# Patient Record
Sex: Female | Born: 1990
Health system: Southern US, Community
[De-identification: ages and names within clinical notes are randomized; demographics above are authoritative.]

## PROBLEM LIST (undated history)

## (undated) DIAGNOSIS — Z8489 Family history of other specified conditions: Secondary | ICD-10-CM

## (undated) DIAGNOSIS — E282 Polycystic ovarian syndrome: Secondary | ICD-10-CM

## (undated) DIAGNOSIS — G43109 Migraine with aura, not intractable, without status migrainosus: Secondary | ICD-10-CM

## (undated) HISTORY — PX: EYE SURGERY: SHX253

## (undated) HISTORY — DX: Family history of other specified conditions: Z84.89

---

## 2013-12-18 ENCOUNTER — Ambulatory Visit: Payer: Self-pay | Admitting: Family Medicine

## 2014-01-18 ENCOUNTER — Other Ambulatory Visit (INDEPENDENT_AMBULATORY_CARE_PROVIDER_SITE_OTHER): Payer: Self-pay

## 2014-01-18 ENCOUNTER — Other Ambulatory Visit (INDEPENDENT_AMBULATORY_CARE_PROVIDER_SITE_OTHER): Payer: Self-pay | Admitting: General Surgery

## 2014-01-18 DIAGNOSIS — K8050 Calculus of bile duct without cholangitis or cholecystitis without obstruction: Secondary | ICD-10-CM

## 2014-01-21 ENCOUNTER — Ambulatory Visit (HOSPITAL_COMMUNITY)
Admission: RE | Admit: 2014-01-21 | Discharge: 2014-01-21 | Disposition: A | Discharge status: Home / Self Care | Payer: BC Managed Care – PPO | Source: Ambulatory Visit | Attending: General Surgery | Admitting: General Surgery

## 2014-01-21 DIAGNOSIS — K8050 Calculus of bile duct without cholangitis or cholecystitis without obstruction: Secondary | ICD-10-CM | POA: Diagnosis present

## 2014-01-21 MED ORDER — TECHNETIUM TC 99M MEBROFENIN IV KIT
5.0000 | PACK | Freq: Once | INTRAVENOUS | Status: AC | PRN
  Administered 2014-01-21: 5 via INTRAVENOUS

## 2014-01-21 MED ORDER — STERILE WATER FOR INJECTION IJ SOLN
INTRAMUSCULAR | Status: AC
  Filled 2014-01-21: qty 10

## 2014-01-21 MED ORDER — SINCALIDE 5 MCG IJ SOLR
0.0200 ug/kg | Freq: Once | INTRAMUSCULAR | Status: AC
  Administered 2014-01-21: 1.41 ug via INTRAVENOUS

## 2014-01-21 MED ORDER — SINCALIDE 5 MCG IJ SOLR
INTRAMUSCULAR | Status: AC
  Administered 2014-01-21: 1.41 ug via INTRAVENOUS
  Filled 2014-01-21: qty 5

## 2014-02-15 ENCOUNTER — Other Ambulatory Visit (INDEPENDENT_AMBULATORY_CARE_PROVIDER_SITE_OTHER): Payer: Self-pay | Admitting: General Surgery

## 2014-02-17 NOTE — Pre-Procedure Instructions (Addendum)
Jessica Johnson  02/17/2014   Your procedure is scheduled on:  Monday, February 22, 2014 at 7:30 AM.   [facility] at 5:30 AM.   Call this number if you have problems the morning of surgery: [phone]                Any questions prior to day of surgery, please call [phone].   Remember:   Do not eat food or drink liquids after midnight.   Take these medicines the morning of surgery with A SIP OF WATER:    Do not wear jewelry, make-up or nail polish.  Do not wear lotions, powders, or perfumes. You may NOT wear deodorant the morning of surgery.   Do not shave underarms & legs 48 hours prior to surgery.   Do not bring valuables to the hospital.  [facility] any belongings or valuables.               Contacts, dentures or bridgework may not be worn into surgery.  Leave suitcase in the car. After surgery it may be brought to your room.  For patients admitted to the hospital, discharge time is determined by your treatment team.             Special Instructions: [place] - Preparing for Surgery  Before surgery, you can play an important role.  Because skin is not sterile, your skin needs to be as free of germs as possible.  You can reduce the number of germs on you skin by washing with CHG (chlorahexidine gluconate) soap before surgery.  CHG is an antiseptic cleaner which kills germs and bonds with the skin to continue killing germs even after washing.  Please DO NOT use if you have an allergy to CHG or antibacterial soaps.  If your skin becomes reddened/irritated stop using the CHG and inform your nurse when you arrive at Short Stay.  Do not shave (including legs and underarms) for at least 48 hours prior to the first CHG shower.  You may shave your face.  Please follow these instructions carefully:   1.  Shower with CHG Soap the night before surgery and the  morning of Surgery.   2.  If you choose to wash  your hair, wash your hair first as usual with your normal shampoo.  3.  After you shampoo, rinse your hair and body thoroughly to remove the Shampoo.   4.  Use CHG as you would any other liquid soap.  You can apply chg directly  to the skin and wash gently with scrungie or a clean washcloth.   5.  Apply the CHG Soap to your body ONLY FROM THE NECK DOWN.   Do not use on open wounds or open sores.  Avoid contact with your eyes, ears, mouth and genitals (private parts).  Wash genitals (private parts) with your normal soap.   6.  Wash thoroughly, paying special attention to the area where your surgery will be performed.  7.  Thoroughly rinse your body with warm water from the neck down.  8.  DO NOT shower/wash with your normal soap after using and rinsing off the CHG Soap.   9.  Pat yourself dry with a clean towel.            10.  Wear clean pajamas.            11.  Place clean sheets on your bed  the night of your first shower and do not sleep with pets.  Day of Surgery  Do not apply any lotions the morning of surgery.  Please wear clean clothes to the hospital.     Please read over the following fact sheets that you were given: Pain Booklet, Coughing and Deep Breathing and Surgical Site Infection Prevention

## 2014-02-18 ENCOUNTER — Encounter (HOSPITAL_COMMUNITY): Payer: Self-pay

## 2014-02-18 ENCOUNTER — Encounter (HOSPITAL_COMMUNITY)
Admission: RE | Admit: 2014-02-18 | Discharge: 2014-02-18 | Disposition: A | Discharge status: Home / Self Care | Payer: BC Managed Care – PPO | Source: Ambulatory Visit | Attending: General Surgery | Admitting: General Surgery

## 2014-02-18 DIAGNOSIS — Z01812 Encounter for preprocedural laboratory examination: Secondary | ICD-10-CM | POA: Diagnosis present

## 2014-02-18 LAB — HCG, SERUM, QUALITATIVE: Preg, Serum: NEGATIVE

## 2014-02-18 LAB — COMPREHENSIVE METABOLIC PANEL
ALT: 15 U/L (ref 0–35)
AST: 19 U/L (ref 0–37)
Albumin: 3.8 g/dL (ref 3.5–5.2)
Alkaline Phosphatase: 53 U/L (ref 39–117)
Anion gap: 14 (ref 5–15)
BUN: 11 mg/dL (ref 6–23)
CALCIUM: 9.2 mg/dL (ref 8.4–10.5)
CO2: 24 mEq/L (ref 19–32)
CREATININE: 0.68 mg/dL (ref 0.50–1.10)
Chloride: 103 mEq/L (ref 96–112)
GFR calc Af Amer: >90 mL/min (ref >90)
GFR calc non Af Amer: >90 mL/min (ref >90)
GLUCOSE: 82 mg/dL (ref 70–99)
Potassium: 3.8 mEq/L (ref 3.7–5.3)
Sodium: 141 mEq/L (ref 137–147)
TOTAL PROTEIN: 7.6 g/dL (ref 6.0–8.3)
Total Bilirubin: 0.3 mg/dL (ref 0.3–1.2)

## 2014-02-18 LAB — CBC
HEMATOCRIT: 40.1 % (ref 36.0–46.0)
Hemoglobin: 13 g/dL (ref 12.0–15.0)
MCH: 25.8 pg — ABNORMAL LOW (ref 26.0–34.0)
MCHC: 32.4 g/dL (ref 30.0–36.0)
MCV: 79.7 fL (ref 78.0–100.0)
Platelets: 242 10*3/uL (ref 150–400)
RBC: 5.03 MIL/uL (ref 3.87–5.11)
RDW: 14.1 % (ref 11.5–15.5)
WBC: 6.5 10*3/uL (ref 4.0–10.5)

## 2014-02-21 MED ORDER — CHLORHEXIDINE GLUCONATE 4 % EX LIQD
1.0000 "application " | Freq: Once | CUTANEOUS | Status: DC
  Filled 2014-02-21: qty 15

## 2014-02-22 ENCOUNTER — Encounter (HOSPITAL_COMMUNITY): Payer: Self-pay | Admitting: *Deleted

## 2014-02-22 ENCOUNTER — Encounter (HOSPITAL_COMMUNITY)
Admission: RE | Disposition: A | Discharge status: Home / Self Care | Payer: Self-pay | Source: Ambulatory Visit | Attending: General Surgery

## 2014-02-22 ENCOUNTER — Ambulatory Visit (HOSPITAL_COMMUNITY): Payer: BC Managed Care – PPO

## 2014-02-22 ENCOUNTER — Ambulatory Visit (HOSPITAL_COMMUNITY): Payer: BC Managed Care – PPO | Admitting: Anesthesiology

## 2014-02-22 ENCOUNTER — Ambulatory Visit (HOSPITAL_COMMUNITY)
Admission: RE | Admit: 2014-02-22 | Discharge: 2014-02-22 | Disposition: A | Discharge status: Home / Self Care | Payer: BC Managed Care – PPO | Source: Ambulatory Visit | Attending: General Surgery | Admitting: General Surgery

## 2014-02-22 DIAGNOSIS — K219 Gastro-esophageal reflux disease without esophagitis: Secondary | ICD-10-CM | POA: Diagnosis not present

## 2014-02-22 DIAGNOSIS — K819 Cholecystitis, unspecified: Secondary | ICD-10-CM | POA: Insufficient documentation

## 2014-02-22 DIAGNOSIS — K8050 Calculus of bile duct without cholangitis or cholecystitis without obstruction: Secondary | ICD-10-CM | POA: Diagnosis present

## 2014-02-22 DIAGNOSIS — K8020 Calculus of gallbladder without cholecystitis without obstruction: Secondary | ICD-10-CM

## 2014-02-22 HISTORY — PX: CHOLECYSTECTOMY: SHX55

## 2014-02-22 SURGERY — LAPAROSCOPIC CHOLECYSTECTOMY WITH INTRAOPERATIVE CHOLANGIOGRAM
Anesthesia: General | Site: Abdomen

## 2014-02-22 MED ORDER — FENTANYL CITRATE 0.05 MG/ML IJ SOLN
INTRAMUSCULAR | Status: AC
  Filled 2014-02-22: qty 5

## 2014-02-22 MED ORDER — FENTANYL CITRATE 0.05 MG/ML IJ SOLN
INTRAMUSCULAR | Status: DC | PRN
  Administered 2014-02-22: 100 ug via INTRAVENOUS
  Administered 2014-02-22: 50 ug via INTRAVENOUS
  Administered 2014-02-22: 100 ug via INTRAVENOUS
  Administered 2014-02-22: 50 ug via INTRAVENOUS

## 2014-02-22 MED ORDER — DEXAMETHASONE SODIUM PHOSPHATE 10 MG/ML IJ SOLN
INTRAMUSCULAR | Status: DC | PRN
  Administered 2014-02-22: 10 mg via INTRAVENOUS

## 2014-02-22 MED ORDER — NEOSTIGMINE METHYLSULFATE 10 MG/10ML IV SOLN
INTRAVENOUS | Status: DC | PRN
  Administered 2014-02-22: 4 mg via INTRAVENOUS

## 2014-02-22 MED ORDER — ROCURONIUM BROMIDE 100 MG/10ML IV SOLN
INTRAVENOUS | Status: DC | PRN
  Administered 2014-02-22: 50 mg via INTRAVENOUS

## 2014-02-22 MED ORDER — SODIUM CHLORIDE 0.9 % IR SOLN
Status: DC | PRN
  Administered 2014-02-22: 1000 mL

## 2014-02-22 MED ORDER — CEFAZOLIN SODIUM-DEXTROSE 2-3 GM-% IV SOLR
INTRAVENOUS | Status: AC
  Filled 2014-02-22: qty 50

## 2014-02-22 MED ORDER — HYDROMORPHONE HCL 1 MG/ML IJ SOLN
INTRAMUSCULAR | Status: AC
  Administered 2014-02-22: 0.5 mg via INTRAVENOUS
  Filled 2014-02-22: qty 1

## 2014-02-22 MED ORDER — MIDAZOLAM HCL 2 MG/2ML IJ SOLN
INTRAMUSCULAR | Status: AC
  Filled 2014-02-22: qty 2

## 2014-02-22 MED ORDER — OXYCODONE-ACETAMINOPHEN 5-325 MG PO TABS: 1.0000 | ORAL_TABLET | ORAL | Status: DC | PRN

## 2014-02-22 MED ORDER — LIDOCAINE HCL (CARDIAC) 20 MG/ML IV SOLN
INTRAVENOUS | Status: DC | PRN
  Administered 2014-02-22: 100 mg via INTRAVENOUS

## 2014-02-22 MED ORDER — ONDANSETRON HCL 4 MG/2ML IJ SOLN
INTRAMUSCULAR | Status: DC | PRN
  Administered 2014-02-22 (×2): 4 mg via INTRAVENOUS

## 2014-02-22 MED ORDER — SODIUM CHLORIDE 0.9 % IV SOLN
INTRAVENOUS | Status: DC | PRN
  Administered 2014-02-22: 07:00:00

## 2014-02-22 MED ORDER — GLYCOPYRROLATE 0.2 MG/ML IJ SOLN
INTRAMUSCULAR | Status: AC
  Filled 2014-02-22: qty 3

## 2014-02-22 MED ORDER — OXYCODONE HCL 5 MG/5ML PO SOLN: 5.0000 mg | Freq: Once | ORAL | Status: DC | PRN

## 2014-02-22 MED ORDER — MIDAZOLAM HCL 5 MG/5ML IJ SOLN
INTRAMUSCULAR | Status: DC | PRN
  Administered 2014-02-22 (×2): 1 mg via INTRAVENOUS

## 2014-02-22 MED ORDER — ROCURONIUM BROMIDE 50 MG/5ML IV SOLN
INTRAVENOUS | Status: AC
  Filled 2014-02-22: qty 1

## 2014-02-22 MED ORDER — OXYCODONE HCL 5 MG PO TABS: 5.0000 mg | ORAL_TABLET | Freq: Once | ORAL | Status: DC | PRN

## 2014-02-22 MED ORDER — PROMETHAZINE HCL 25 MG/ML IJ SOLN
6.2500 mg | INTRAMUSCULAR | Status: DC | PRN
  Administered 2014-02-22: 6.25 mg via INTRAVENOUS

## 2014-02-22 MED ORDER — LACTATED RINGERS IV SOLN
INTRAVENOUS | Status: DC | PRN
  Administered 2014-02-22: 07:00:00 via INTRAVENOUS

## 2014-02-22 MED ORDER — ACETAMINOPHEN 325 MG PO TABS
ORAL_TABLET | ORAL | Status: AC
  Filled 2014-02-22: qty 2

## 2014-02-22 MED ORDER — PROPOFOL 10 MG/ML IV BOLUS
INTRAVENOUS | Status: AC
  Filled 2014-02-22: qty 20

## 2014-02-22 MED ORDER — BUPIVACAINE-EPINEPHRINE 0.25% -1:200000 IJ SOLN
INTRAMUSCULAR | Status: DC | PRN
  Administered 2014-02-22: 30 mL

## 2014-02-22 MED ORDER — HYDROMORPHONE HCL 1 MG/ML IJ SOLN
0.2500 mg | INTRAMUSCULAR | Status: DC | PRN
  Administered 2014-02-22 (×4): 0.5 mg via INTRAVENOUS

## 2014-02-22 MED ORDER — CEFAZOLIN SODIUM-DEXTROSE 2-3 GM-% IV SOLR: 2.0000 g | INTRAVENOUS | Status: DC

## 2014-02-22 MED ORDER — OXYCODONE-ACETAMINOPHEN 5-325 MG PO TABS
ORAL_TABLET | ORAL | Status: AC
  Administered 2014-02-22: 2 via ORAL
  Filled 2014-02-22: qty 2

## 2014-02-22 MED ORDER — NEOSTIGMINE METHYLSULFATE 10 MG/10ML IV SOLN
INTRAVENOUS | Status: AC
  Filled 2014-02-22: qty 1

## 2014-02-22 MED ORDER — PROMETHAZINE HCL 25 MG/ML IJ SOLN
INTRAMUSCULAR | Status: AC
  Administered 2014-02-22: 6.25 mg via INTRAVENOUS
  Filled 2014-02-22: qty 1

## 2014-02-22 MED ORDER — GLYCOPYRROLATE 0.2 MG/ML IJ SOLN
INTRAMUSCULAR | Status: DC | PRN
  Administered 2014-02-22: 0.6 mg via INTRAVENOUS

## 2014-02-22 MED ORDER — OXYCODONE HCL 5 MG PO TABS
ORAL_TABLET | ORAL | Status: DC
Start: 2014-02-22 — End: 2014-02-22
  Filled 2014-02-22: qty 1

## 2014-02-22 MED ORDER — PROPOFOL 10 MG/ML IV BOLUS
INTRAVENOUS | Status: DC | PRN
  Administered 2014-02-22: 200 mg via INTRAVENOUS

## 2014-02-22 MED ORDER — 0.9 % SODIUM CHLORIDE (POUR BTL) OPTIME
TOPICAL | Status: DC | PRN
  Administered 2014-02-22: 1000 mL

## 2014-02-22 MED ORDER — BUPIVACAINE-EPINEPHRINE (PF) 0.25% -1:200000 IJ SOLN
INTRAMUSCULAR | Status: AC
  Filled 2014-02-22: qty 30

## 2014-02-22 MED ORDER — ONDANSETRON HCL 4 MG/2ML IJ SOLN
INTRAMUSCULAR | Status: AC
  Filled 2014-02-22: qty 2

## 2014-02-22 MED ORDER — OXYCODONE-ACETAMINOPHEN 5-325 MG PO TABS
1.0000 | ORAL_TABLET | Freq: Once | ORAL | Status: AC
  Administered 2014-02-22: 2 via ORAL

## 2014-02-22 MED ORDER — CIPROFLOXACIN IN D5W 400 MG/200ML IV SOLN
INTRAVENOUS | Status: AC
  Administered 2014-02-22: 400 mg via INTRAVENOUS
  Filled 2014-02-22: qty 200

## 2014-02-22 MED ORDER — LIDOCAINE HCL (CARDIAC) 20 MG/ML IV SOLN
INTRAVENOUS | Status: AC
  Filled 2014-02-22: qty 5

## 2014-02-22 SURGICAL SUPPLY — 45 items
APPLIER CLIP 5 13 M/L LIGAMAX5 (MISCELLANEOUS) ×2
CANISTER SUCTION 2500CC (MISCELLANEOUS) ×2 IMPLANT
CATH REDDICK CHOLANGI 4FR 50CM (CATHETERS) ×2 IMPLANT
CHLORAPREP W/TINT 26ML (MISCELLANEOUS) ×2 IMPLANT
CLIP APPLIE 5 13 M/L LIGAMAX5 (MISCELLANEOUS) ×1 IMPLANT
COVER MAYO STAND STRL (DRAPES) ×2 IMPLANT
COVER SURGICAL LIGHT HANDLE (MISCELLANEOUS) ×4 IMPLANT
DERMABOND ADVANCED (GAUZE/BANDAGES/DRESSINGS) ×1
DERMABOND ADVANCED .7 DNX12 (GAUZE/BANDAGES/DRESSINGS) ×1 IMPLANT
DRAPE C-ARM 42X72 X-RAY (DRAPES) ×2 IMPLANT
DRAPE LAPAROSCOPIC ABDOMINAL (DRAPES) ×2 IMPLANT
DRAPE UTILITY 15X26 W/TAPE STR (DRAPE) ×4 IMPLANT
ELECT REM PT RETURN 9FT ADLT (ELECTROSURGICAL) ×2
ELECTRODE REM PT RTRN 9FT ADLT (ELECTROSURGICAL) ×1 IMPLANT
GLOVE BIO SURGEON STRL SZ7.5 (GLOVE) ×2 IMPLANT
GLOVE BIO SURGEON STRL SZ8 (GLOVE) ×2 IMPLANT
GLOVE BIOGEL PI IND STRL 6.5 (GLOVE) ×1 IMPLANT
GLOVE BIOGEL PI IND STRL 7.0 (GLOVE) ×1 IMPLANT
GLOVE BIOGEL PI IND STRL 7.5 (GLOVE) ×1 IMPLANT
GLOVE BIOGEL PI IND STRL 8 (GLOVE) ×1 IMPLANT
GLOVE BIOGEL PI INDICATOR 6.5 (GLOVE) ×1
GLOVE BIOGEL PI INDICATOR 7.0 (GLOVE) ×1
GLOVE BIOGEL PI INDICATOR 7.5 (GLOVE) ×1
GLOVE BIOGEL PI INDICATOR 8 (GLOVE) ×1
GLOVE ECLIPSE 7.5 STRL STRAW (GLOVE) ×2 IMPLANT
GLOVE SURG SS PI 6.5 STRL IVOR (GLOVE) ×2 IMPLANT
GOWN STRL REUS W/ TWL LRG LVL3 (GOWN DISPOSABLE) ×4 IMPLANT
GOWN STRL REUS W/TWL LRG LVL3 (GOWN DISPOSABLE) ×4
IV CATH 14GX2 1/4 (CATHETERS) ×2 IMPLANT
KIT BASIN OR (CUSTOM PROCEDURE TRAY) ×2 IMPLANT
KIT ROOM TURNOVER OR (KITS) ×2 IMPLANT
NS IRRIG 1000ML POUR BTL (IV SOLUTION) ×2 IMPLANT
PAD ARMBOARD 7.5X6 YLW CONV (MISCELLANEOUS) ×2 IMPLANT
POUCH SPECIMEN RETRIEVAL 10MM (ENDOMECHANICALS) ×2 IMPLANT
SCISSORS LAP 5X35 DISP (ENDOMECHANICALS) ×2 IMPLANT
SET IRRIG TUBING LAPAROSCOPIC (IRRIGATION / IRRIGATOR) ×2 IMPLANT
SLEEVE ENDOPATH XCEL 5M (ENDOMECHANICALS) ×4 IMPLANT
SPECIMEN JAR SMALL (MISCELLANEOUS) ×2 IMPLANT
SUT MNCRL AB 4-0 PS2 18 (SUTURE) ×2 IMPLANT
TOWEL OR 17X24 6PK STRL BLUE (TOWEL DISPOSABLE) ×2 IMPLANT
TOWEL OR 17X26 10 PK STRL BLUE (TOWEL DISPOSABLE) ×2 IMPLANT
TRAY LAPAROSCOPIC (CUSTOM PROCEDURE TRAY) ×2 IMPLANT
TROCAR XCEL BLUNT TIP 100MML (ENDOMECHANICALS) ×2 IMPLANT
TROCAR XCEL NON-BLD 5MMX100MML (ENDOMECHANICALS) ×2 IMPLANT
TUBING INSUFFLATION (TUBING) ×2 IMPLANT

## 2014-02-22 NOTE — Interval H&P Note (Signed)
History and Physical Interval Note:  02/22/2014 6:58 AM  Jessica Johnson  has presented today for surgery, with the diagnosis of Biliary Colic  The various methods of treatment have been discussed with the patient and family. After consideration of risks, benefits and other options for treatment, the patient has consented to  Procedure(s): LAPAROSCOPIC CHOLECYSTECTOMY WITH INTRAOPERATIVE CHOLANGIOGRAM (N/A) as a surgical intervention .  The patient's history has been reviewed, patient examined, no change in status, stable for surgery.  I have reviewed the patient's chart and labs.  Questions were answered to the patient's satisfaction.     TOTH III,PAUL S

## 2014-02-22 NOTE — Discharge Instructions (Signed)

## 2014-02-22 NOTE — H&P (Signed)
Jessica Johnson 01/21/2014 2:06 PM Location: [facility] Patient #: [id number] DOB: 1991-03-04 Married / Language: English / Race: White Female  History of Present Illness Jessica Johnson(Micael Barb S. Carolynne Edouardoth MD; 01/21/2014 2:36 PM) Patient words: new Pt. Gallbladder.  The patient is a 23 year old female who presents with abdominal pain. We're asked to see the patient in consultation by Dr. Sherryll BurgerShah to evaluate her for biliary colic. The patient is a 23 year old white female who has been experiencing postprandial abdominal pain with nausea and vomiting for the past several months. Her fatty foods seem to make this worse. She underwent an ultrasound which showed a small polyp in the gallbladder. She also had a CCK HIDA scan which showed normal ejection fraction but her symptoms were recreated with the injection of CCK.   Other Problems Jessica Johnson(Michele Daniels, CMA; 01/21/2014 2:06 PM) Gastroesophageal Reflux Disease Hemorrhoids Migraine Headache  Past Surgical History Jessica Johnson(Michele Daniels, CMA; 01/21/2014 2:06 PM) No pertinent past surgical history  Diagnostic Studies History Jessica Johnson(Michele Daniels, CMA; 01/21/2014 2:06 PM) Colonoscopy never Mammogram never Pap Smear 1-5 years ago  Allergies Jessica Johnson(Michele Daniels, CMA; 01/21/2014 2:08 PM) Penicillins  Medication History Jessica Johnson(Michele Daniels, CMA; 01/21/2014 2:10 PM) Zofran (8MG  Tablet, Oral) Active. Magnesium Gluconate (500MG  Tablet, Oral) Active.  Pregnancy / Birth History Jessica Johnson(Michele Daniels, CMA; 01/21/2014 2:06 PM) Age at menarche 12 years. Gravida 0 Para 0 Regular periods  Review of Systems Jessica Johnson(Michele Daniels CMA; 01/21/2014 2:06 PM) General Present- Fatigue. Not Present- Appetite Loss, Chills, Fever, Night Sweats, Weight Gain and Weight Loss. Skin Not Present- Change in Wart/Mole, Dryness, Hives, Jaundice, New Lesions, Non-Healing Wounds, Rash and Ulcer. HEENT Not Present- Earache, Hearing Loss, Hoarseness, Nose Bleed, Oral Ulcers, Ringing in the Ears,  Seasonal Allergies, Sinus Pain, Sore Throat, Visual Disturbances, Wears glasses/contact lenses and Yellow Eyes. Respiratory Not Present- Bloody sputum, Chronic Cough, Difficulty Breathing, Snoring and Wheezing. Breast Not Present- Breast Mass, Breast Pain, Nipple Discharge and Skin Changes. Cardiovascular Not Present- Chest Pain, Difficulty Breathing Lying Down, Leg Cramps, Palpitations, Rapid Heart Rate, Shortness of Breath and Swelling of Extremities. Gastrointestinal Present- Abdominal Pain, Bloating, Constipation, Indigestion, Nausea and Vomiting. Not Present- Bloody Stool, Change in Bowel Habits, Chronic diarrhea, Difficulty Swallowing, Excessive gas, Gets full quickly at meals, Hemorrhoids and Rectal Pain. Female Genitourinary Not Present- Frequency, Nocturia, Painful Urination, Pelvic Pain and Urgency. Musculoskeletal Not Present- Back Pain, Joint Pain, Joint Stiffness, Muscle Pain, Muscle Weakness and Swelling of Extremities. Neurological Not Present- Decreased Memory, Fainting, Headaches, Numbness, Seizures, Tingling, Tremor, Trouble walking and Weakness. Psychiatric Not Present- Anxiety, Bipolar, Change in Sleep Pattern, Depression, Fearful and Frequent crying. Endocrine Not Present- Cold Intolerance, Excessive Hunger, Hair Changes, Heat Intolerance, Hot flashes and New Diabetes. Hematology Not Present- Easy Bruising, Excessive bleeding, Gland problems, HIV and Persistent Infections.   Vitals Jessica Johnson(Michele Daniels CMA; 01/21/2014 2:11 PM) 01/21/2014 2:11 PM Weight: 163.5 lb Height: 66in Body Surface Area: 1.86 m Body Mass Index: 26.39 kg/m Temp.: 96.3F  Pulse: 58 (Regular)  BP: 102/58 (Sitting, Left Arm, Standard)    Physical Exam Renae Fickle(Denali Becvar S. Carolynne Edouardoth MD; 01/21/2014 2:37 PM) General Mental Status-Alert. General Appearance-Consistent with stated age. Hydration-Well hydrated. Voice-Normal.  Head and Neck Head-normocephalic, atraumatic with no lesions or palpable  masses. Trachea-midline. Thyroid Gland Characteristics - normal size and consistency.  Eye Eyeball - Bilateral-Extraocular movements intact. Sclera/Conjunctiva - Bilateral-No scleral icterus.  Chest and Lung Exam Chest and lung exam reveals -quiet, even and easy respiratory effort with no use of accessory muscles and on auscultation, normal breath sounds, no  adventitious sounds and normal vocal resonance. Inspection Chest Wall - Normal. Back - normal.  Breast Breast - Left-Symmetric, Non Tender, No Biopsy scars, no Dimpling, No Inflammation, No Lumpectomy scars, No Mastectomy scars, No Peau d' Orange. Breast - Right-Symmetric, Non Tender, No Biopsy scars, no Dimpling, No Inflammation, No Lumpectomy scars, No Mastectomy scars, No Peau d' Orange. Breast Lump-No Palpable Breast Mass.  Cardiovascular Cardiovascular examination reveals -normal heart sounds, regular rate and rhythm with no murmurs and normal pedal pulses bilaterally.  Abdomen Note: The abdomen is soft with minimal tenderness in the upper abdomen. There is no palpable mass. There is no guarding.   Neurologic Neurologic evaluation reveals -alert and oriented x 3 with no impairment of recent or remote memory. Mental Status-Normal.  Musculoskeletal Normal Exam - Left-Upper Extremity Strength Normal and Lower Extremity Strength Normal. Normal Exam - Right-Upper Extremity Strength Normal and Lower Extremity Strength Normal.  Lymphatic Head & Neck  General Head & Neck Lymphatics: Bilateral - Description - Normal. Axillary  General Axillary Region: Bilateral - Description - Normal. Tenderness - Non Tender. Femoral & Inguinal  Generalized Femoral & Inguinal Lymphatics: Bilateral - Description - Normal. Tenderness - Non Tender.    Assessment & Plan Renae Fickle(Shandreka Dante S. Toth MD; 01/21/2014 2:33 PM) BILIARY COLIC (574.20  K80.50) Impression: The patient has had recurring biliary colic type symptoms for  the last several months. Because of the risk of further painful episodes I think she would benefit from having her gallbladder removed. She understands that there is no guarantee that this will help her symptoms. I have explained to her in detail the dress and benefits of the operation to remove the gallbladder as well some of the technical aspects and she understands and wishes to proceed.     Signed by Caleen EssexPaul S Toth, MD (01/21/2014 2:38 PM)

## 2014-02-22 NOTE — Anesthesia Preprocedure Evaluation (Addendum)
Anesthesia Evaluation  Patient identified by MRN, date of birth, ID band Patient awake    Reviewed: Allergy & Precautions, H&P , NPO status , Patient's Chart, lab work & pertinent test results  Airway Mallampati: I  TM Distance: <3 FB Neck ROM: Full    Dental  (+) Teeth Intact, Dental Advisory Given   Pulmonary neg pulmonary ROS,  breath sounds clear to auscultation        Cardiovascular negative cardio ROS  Rhythm:Regular Rate:Normal     Neuro/Psych    GI/Hepatic Neg liver ROS,   Endo/Other  negative endocrine ROS  Renal/GU negative Renal ROS     Musculoskeletal negative musculoskeletal ROS (+)   Abdominal   Peds  Hematology negative hematology ROS (+)   Anesthesia Other Findings   Reproductive/Obstetrics                            Anesthesia Physical Anesthesia Plan  ASA: I  Anesthesia Plan: General   Post-op Pain Management:    Induction: Intravenous  Airway Management Planned: Oral ETT  Additional Equipment:   Intra-op Plan:   Post-operative Plan: Extubation in OR  Informed Consent: I have reviewed the patients History and Physical, chart, labs and discussed the procedure including the risks, benefits and alternatives for the proposed anesthesia with the patient or authorized representative who has indicated his/her understanding and acceptance.   Dental advisory given  Plan Discussed with: CRNA and Surgeon  Anesthesia Plan Comments:         Anesthesia Quick Evaluation

## 2014-02-22 NOTE — Anesthesia Postprocedure Evaluation (Signed)
  Anesthesia Post-op Note  Patient: Jessica Johnson  Procedure(s) Performed: Procedure(s): LAPAROSCOPIC CHOLECYSTECTOMY WITH INTRAOPERATIVE CHOLANGIOGRAM (N/A)  Patient Location: PACU  Anesthesia Type:General  Level of Consciousness: awake and alert   Airway and Oxygen Therapy: Patient Spontanous Breathing  Post-op Pain: mild  Post-op Assessment: Post-op Vital signs reviewed  Post-op Vital Signs: stable  Last Vitals:  Filed Vitals:   02/22/14 1102  BP: 119/66  Pulse: 70  Temp: 36.3 C  Resp: 20    Complications: No apparent anesthesia complications

## 2014-02-22 NOTE — Transfer of Care (Signed)
Immediate Anesthesia Transfer of Care Note  Patient: Jessica Johnson  Procedure(s) Performed: Procedure(s): LAPAROSCOPIC CHOLECYSTECTOMY WITH INTRAOPERATIVE CHOLANGIOGRAM (N/A)  Patient Location: PACU  Anesthesia Type:General  Level of Consciousness: awake and alert   Airway & Oxygen Therapy: Patient Spontanous Breathing and Patient connected to nasal cannula oxygen  Post-op Assessment: Report given to PACU RN and Post -op Vital signs reviewed and stable  Post vital signs: Reviewed and stable  Complications: No apparent anesthesia complications

## 2014-02-22 NOTE — Anesthesia Procedure Notes (Signed)
Procedure Name: Intubation Date/Time: 02/22/2014 7:35 AM Performed by: Armandina GemmaMIRARCHI, Haden Cavenaugh Pre-anesthesia Checklist: Patient identified, Timeout performed, Emergency Drugs available, Suction available and Patient being monitored Patient Re-evaluated:Patient Re-evaluated prior to inductionOxygen Delivery Method: Circle system utilized Preoxygenation: Pre-oxygenation with 100% oxygen Intubation Type: IV induction Ventilation: Mask ventilation without difficulty Laryngoscope Size: Miller and 2 Grade View: Grade II Tube type: Oral Tube size: 7.0 mm Number of attempts: 1 Airway Equipment and Method: Stylet Placement Confirmation: ETT inserted through vocal cords under direct vision,  positive ETCO2 and breath sounds checked- equal and bilateral Secured at: 22 cm Tube secured with: Tape Dental Injury: Teeth and Oropharynx as per pre-operative assessment  Comments: IV induction Massagee- good mask- laryngoscopy with MAC by paramedic student-Connor Fransico MichaelBrennan- supervised by Dr Jacklynn BueMassagee- student reports poor view- epiglottis only- CRNA laryngoscopy with Hyacinth MeekerMiller 2 - blood in back of throat present as Hyacinth MeekerMiller was advanced - suctioned- advanced ETT thru cords- atraumatic teeth and mouth as preop

## 2014-02-22 NOTE — Op Note (Addendum)
02/22/2014  8:47 AM  PATIENT:  Jessica Johnson  23 y.o. female  PRE-OPERATIVE DIAGNOSIS:  Biliary Colic  POST-OPERATIVE DIAGNOSIS:  Biliary Colic  PROCEDURE:  Procedure(s): LAPAROSCOPIC CHOLECYSTECTOMY WITH INTRAOPERATIVE CHOLANGIOGRAM (N/A)  SURGEON:  Surgeon(s) and Role:    * Griselda MinerPaul Toth III, MD - Primary  PHYSICIAN ASSISTANT:   ASSISTANTS: Myrtie SomanSharon Hitchcock, RNFA   ANESTHESIA:   general  EBL:     BLOOD ADMINISTERED:none  DRAINS: none   LOCAL MEDICATIONS USED:  MARCAINE     SPECIMEN:  Source of Specimen:  gallbladder  DISPOSITION OF SPECIMEN:  PATHOLOGY  COUNTS:  YES  TOURNIQUET:  * No tourniquets in log *  DICTATION: .Dragon Dictation   Procedure: After informed consent was obtained the patient was brought to the operating room and placed in the supine position on the operating room table. After adequate induction of general anesthesia the patient's abdomen was prepped with ChloraPrep allowed to dry and draped in usual sterile manner. The area below the umbilicus was infiltrated with quarter percent  Marcaine. A small incision was made with a 15 blade knife. The incision was carried down through the subcutaneous tissue bluntly with a hemostat and [employer] retractors. The linea alba was identified. The linea alba was incised with a 15 blade knife and each side was grasped with Coker clamps. The preperitoneal space was then probed with a hemostat until the peritoneum was opened and access was gained to the abdominal cavity. A 0 Vicryl pursestring stitch was placed in the fascia surrounding the opening. A Hassan cannula was then placed through the opening and anchored in place with the previously placed Vicryl purse string stitch. The abdomen was insufflated with carbon dioxide without difficulty. A laparoscope was inserted [facility] in the right upper quadrant was inspected. Next the epigastric region was infiltrated with % Marcaine. A small incision was made  with a 15 blade knife. A 5 mm port was placed bluntly through this incision into the abdominal cavity under direct vision. Next 2 sites were chosen laterally on the right side of the abdomen for placement of 5 mm ports. Each of these areas was infiltrated with quarter percent Marcaine. Small stab incisions were made with a 15 blade knife. 5 mm ports were then placed bluntly through these incisions into the abdominal cavity under direct vision without difficulty. A blunt grasper was placed through the lateralmost 5 mm port and used to grasp the dome of the gallbladder and elevated anteriorly and superiorly. Another blunt grasper was placed through the other 5 mm port and used to retract the body and neck of the gallbladder. A dissector was placed through the epigastric port and using the electrocautery the peritoneal reflection at the gallbladder neck was opened. Blunt dissection was then carried out in this area until the gallbladder neck-cystic duct junction was readily identified and a good window was created. A single clip was placed on the gallbladder neck. A small  ductotomy was made just below the clip with laparoscopic scissors. A 14-gauge Angiocath was then placed through the anterior abdominal wall under direct vision. A Reddick cholangiogram catheter was then placed through the Angiocath and flushed. The catheter was then placed in the cystic duct and anchored in place with a clip. A cholangiogram was obtained that showed no filling defects good emptying into the duodenum an adequate length on the cystic duct. The anchoring clip and catheters were then removed from the patient. 3 clips were placed proximally on the cystic duct  and the duct was divided between the 2 sets of clips. Posterior to this the cystic artery was identified and again dissected bluntly in a circumferential manner until a good window  was created. 2 clips were placed proximally and one distally on the artery and the artery was divided  between the 2 sets of clips. Next a laparoscopic hook cautery device was used to separate the gallbladder from the liver bed. Prior to completely detaching the gallbladder from the liver bed the liver bed was inspected and several small bleeding points were coagulated with the electrocautery until the area was completely hemostatic. The gallbladder was then detached the rest of it from the liver bed without difficulty. A laparoscopic bag was [facility] the [facility]. The gallbladder was placed within the bag and the bag was sealed. A laparoscope was moved to the epigastric port. The bag with the gallbladder was then removed with the [facility] through the infraumbilical port without difficulty. The fascial defect was then closed with the previously placed Vicryl pursestring stitch as well as with another figure-of-eight 0 Vicryl stitch. The liver bed was inspected again and found to be hemostatic. The abdomen was irrigated with copious amounts of saline until the effluent was clear. The ports were then removed under direct vision without difficulty and were found to be hemostatic. The gas was allowed to escape. The skin incisions were all closed with interrupted 4-0 Monocryl subcuticular stitches. Dermabond dressings were applied. The patient tolerated the procedure well. At the end of the case all needle sponge and instrument counts were correct. The patient was then awakened and taken to recovery in stable condition  PLAN OF CARE: Discharge to home after PACU  PATIENT DISPOSITION:  PACU - hemodynamically stable.   Delay start of Pharmacological VTE agent (>24hrs) due to surgical blood loss or risk of bleeding: not applicable

## 2014-02-23 ENCOUNTER — Encounter (HOSPITAL_COMMUNITY): Payer: Self-pay | Admitting: General Surgery

## 2015-05-02 MED FILL — LETROZOLE 2.5 MG TABLET: 2.5 | 28 days supply | Qty: 10 | Fill #0

## 2015-06-01 MED FILL — LETROZOLE 2.5 MG TABLET: 2.5 | 10 days supply | Qty: 10 | Fill #0

## 2015-06-30 MED FILL — LETROZOLE 2.5 MG TABLET: 2.5 | 30 days supply | Qty: 10 | Fill #0

## 2015-07-12 MED FILL — OVIDREL 250 MCG/0.5 ML SYRG: 250 | 30 days supply | Qty: 1 | Fill #0

## 2015-08-01 MED FILL — LETROZOLE 2.5 MG TABLET: 2.5 | 5 days supply | Qty: 15 | Fill #0

## 2015-09-05 MED FILL — LETROZOLE 2.5 MG TABLET: 2.5 | 15 days supply | Qty: 15 | Fill #0

## 2015-10-03 MED FILL — LETROZOLE 2.5 MG TABLET: 2.5 | 15 days supply | Qty: 15 | Fill #0

## 2015-11-01 MED FILL — LETROZOLE 2.5 MG TABLET: 2.5 | 15 days supply | Qty: 15 | Fill #0

## 2015-11-28 MED FILL — LETROZOLE 2.5 MG TABLET: 2.5 | 15 days supply | Qty: 15 | Fill #0

## 2015-12-27 MED FILL — LETROZOLE 2.5 MG TABLET: 2.5 | 15 days supply | Qty: 15 | Fill #0

## 2016-01-27 MED FILL — LETROZOLE 2.5 MG TABLET: 2.5 | 15 days supply | Qty: 15 | Fill #0

## 2016-03-22 MED FILL — LETROZOLE 2.5 MG TABLET: 2.5 | 15 days supply | Qty: 15 | Fill #0

## 2016-03-28 MED FILL — MEDROXYPROGESTERONE 10 MG T: 10 | 5 days supply | Qty: 5 | Fill #0

## 2016-05-03 MED FILL — NITROFURANTOIN MONO-MCR 100: 100 | 5 days supply | Qty: 10 | Fill #0

## 2016-05-11 MED FILL — SULFAMETHOXAZOLE/TMP DS TAB: 800-160 | 3 days supply | Qty: 6 | Fill #0

## 2016-05-14 MED FILL — LETROZOLE 2.5 MG TABLET: 2.5 | 15 days supply | Qty: 15 | Fill #0

## 2016-06-15 MED FILL — OXYCODONE/APAP 5/325 MG TAB: 5-325 | 2 days supply | Qty: 12 | Fill #0

## 2016-06-15 MED FILL — TAMSULOSIN HCL 0.4 MG CAP: 0.4 | 30 days supply | Qty: 30 | Fill #0

## 2016-08-15 DIAGNOSIS — E288 Other ovarian dysfunction: Secondary | ICD-10-CM | POA: Diagnosis not present

## 2016-08-15 DIAGNOSIS — Z31430 Encounter of female for testing for genetic disease carrier status for procreative management: Secondary | ICD-10-CM | POA: Diagnosis not present

## 2016-08-15 DIAGNOSIS — Z319 Encounter for procreative management, unspecified: Secondary | ICD-10-CM | POA: Diagnosis not present

## 2016-08-15 DIAGNOSIS — E282 Polycystic ovarian syndrome: Secondary | ICD-10-CM | POA: Diagnosis not present

## 2016-08-15 DIAGNOSIS — N978 Female infertility of other origin: Secondary | ICD-10-CM | POA: Diagnosis not present

## 2016-08-25 DIAGNOSIS — Z319 Encounter for procreative management, unspecified: Secondary | ICD-10-CM | POA: Diagnosis not present

## 2016-08-25 DIAGNOSIS — Z3141 Encounter for fertility testing: Secondary | ICD-10-CM | POA: Diagnosis not present

## 2016-11-12 DIAGNOSIS — Z319 Encounter for procreative management, unspecified: Secondary | ICD-10-CM | POA: Diagnosis not present

## 2016-11-12 DIAGNOSIS — E288 Other ovarian dysfunction: Secondary | ICD-10-CM | POA: Diagnosis not present

## 2016-11-12 DIAGNOSIS — Z3141 Encounter for fertility testing: Secondary | ICD-10-CM | POA: Diagnosis not present

## 2016-11-12 DIAGNOSIS — Z113 Encounter for screening for infections with a predominantly sexual mode of transmission: Secondary | ICD-10-CM | POA: Diagnosis not present

## 2016-11-12 DIAGNOSIS — E282 Polycystic ovarian syndrome: Secondary | ICD-10-CM | POA: Diagnosis not present

## 2016-11-12 MED FILL — NORETHINDRONE 5 MG TABLET: 5 | 15 days supply | Qty: 15 | Fill #0

## 2016-11-12 MED FILL — DOXYCYCLINE HYCLATE 100 MG: 100 | 5 days supply | Qty: 10 | Fill #0

## 2016-11-13 DIAGNOSIS — N8500 Endometrial hyperplasia, unspecified: Secondary | ICD-10-CM | POA: Diagnosis not present

## 2016-12-18 DIAGNOSIS — Z3183 Encounter for assisted reproductive fertility procedure cycle: Secondary | ICD-10-CM | POA: Diagnosis not present

## 2016-12-28 DIAGNOSIS — E288 Other ovarian dysfunction: Secondary | ICD-10-CM | POA: Diagnosis not present

## 2016-12-28 DIAGNOSIS — Z3183 Encounter for assisted reproductive fertility procedure cycle: Secondary | ICD-10-CM | POA: Diagnosis not present

## 2017-01-02 DIAGNOSIS — Z3183 Encounter for assisted reproductive fertility procedure cycle: Secondary | ICD-10-CM | POA: Diagnosis not present

## 2017-01-02 DIAGNOSIS — E288 Other ovarian dysfunction: Secondary | ICD-10-CM | POA: Diagnosis not present

## 2017-01-05 DIAGNOSIS — Z3183 Encounter for assisted reproductive fertility procedure cycle: Secondary | ICD-10-CM | POA: Diagnosis not present

## 2017-01-05 DIAGNOSIS — E288 Other ovarian dysfunction: Secondary | ICD-10-CM | POA: Diagnosis not present

## 2017-01-07 DIAGNOSIS — Z2889 Immunization not carried out for other reason: Secondary | ICD-10-CM | POA: Diagnosis not present

## 2017-01-07 DIAGNOSIS — Z3183 Encounter for assisted reproductive fertility procedure cycle: Secondary | ICD-10-CM | POA: Diagnosis not present

## 2017-01-09 DIAGNOSIS — Z3183 Encounter for assisted reproductive fertility procedure cycle: Secondary | ICD-10-CM | POA: Diagnosis not present

## 2017-01-14 DIAGNOSIS — Z3183 Encounter for assisted reproductive fertility procedure cycle: Secondary | ICD-10-CM | POA: Diagnosis not present

## 2017-01-30 DIAGNOSIS — Z3183 Encounter for assisted reproductive fertility procedure cycle: Secondary | ICD-10-CM | POA: Diagnosis not present

## 2017-01-30 DIAGNOSIS — E288 Other ovarian dysfunction: Secondary | ICD-10-CM | POA: Diagnosis not present

## 2017-02-08 DIAGNOSIS — Z3183 Encounter for assisted reproductive fertility procedure cycle: Secondary | ICD-10-CM | POA: Diagnosis not present

## 2017-02-18 DIAGNOSIS — Z3200 Encounter for pregnancy test, result unknown: Secondary | ICD-10-CM | POA: Diagnosis not present

## 2017-02-20 DIAGNOSIS — Z3201 Encounter for pregnancy test, result positive: Secondary | ICD-10-CM | POA: Diagnosis not present

## 2017-03-06 DIAGNOSIS — Z3200 Encounter for pregnancy test, result unknown: Secondary | ICD-10-CM | POA: Diagnosis not present

## 2017-03-15 DIAGNOSIS — O021 Missed abortion: Secondary | ICD-10-CM | POA: Diagnosis not present

## 2017-03-18 DIAGNOSIS — O039 Complete or unspecified spontaneous abortion without complication: Secondary | ICD-10-CM | POA: Diagnosis not present

## 2017-04-02 NOTE — L&D Delivery Note (Signed)
Date of delivery: 02/05/2018 Estimated Date of Delivery: 02/05/18 Patient's last menstrual period was 05/03/2017 (exact date). EGA: [redacted]w[redacted]d  Delivery Note At 12:09 PM a viable female was delivered via Vaginal, Spontaneous (Presentation: OA;  ROA).  APGAR: 9, 9; weight 7 lb 12.5 oz (3530 g).   Placenta status: spontaneous, intact.  Cord:  with the following complications: none.  Cord pH: NA  Mom pushed well over 1.5 hours to deliver a viable female infant.  The head followed by shoulders, which delivered without difficulty, and the rest of the body.  No nuchal cord noted.  Baby to mom's chest.  Cord clamped and cut after 4 min delay.  No cord blood obtained.  Placenta delivered spontaneously, intact, with a 3-vessel cord.  Second degree vaginal laceration repaired with 3-0 Vicryl in standard fashion.  All counts correct. Brisk bleeding following delivery of placenta with 800 mcg cytotec placed PR.  Hemostasis obtained with cytotec, IV pitocin and fundal massage.  Anesthesia: epidural  Episiotomy: None Lacerations: 2nd degree;Vaginal Suture Repair: 3.0 vicryl Est. Blood Loss (mL): 750  Mom to postpartum.  Baby to Couplet care / Skin to Skin.  Tresea Mall, CNM 02/05/2018, 2:41 PM

## 2017-04-26 DIAGNOSIS — G43119 Migraine with aura, intractable, without status migrainosus: Secondary | ICD-10-CM | POA: Diagnosis not present

## 2017-04-26 DIAGNOSIS — R0683 Snoring: Secondary | ICD-10-CM | POA: Diagnosis not present

## 2017-05-01 DIAGNOSIS — Z3141 Encounter for fertility testing: Secondary | ICD-10-CM | POA: Diagnosis not present

## 2017-05-02 DIAGNOSIS — N8500 Endometrial hyperplasia, unspecified: Secondary | ICD-10-CM | POA: Diagnosis not present

## 2017-05-13 DIAGNOSIS — Z3183 Encounter for assisted reproductive fertility procedure cycle: Secondary | ICD-10-CM | POA: Diagnosis not present

## 2017-05-20 DIAGNOSIS — Z3183 Encounter for assisted reproductive fertility procedure cycle: Secondary | ICD-10-CM | POA: Diagnosis not present

## 2017-05-28 DIAGNOSIS — Z3200 Encounter for pregnancy test, result unknown: Secondary | ICD-10-CM | POA: Diagnosis not present

## 2017-05-30 DIAGNOSIS — Z3201 Encounter for pregnancy test, result positive: Secondary | ICD-10-CM | POA: Diagnosis not present

## 2017-06-12 DIAGNOSIS — Z3200 Encounter for pregnancy test, result unknown: Secondary | ICD-10-CM | POA: Diagnosis not present

## 2017-06-17 DIAGNOSIS — O200 Threatened abortion: Secondary | ICD-10-CM | POA: Diagnosis not present

## 2017-06-26 DIAGNOSIS — O0900 Supervision of pregnancy with history of infertility, unspecified trimester: Secondary | ICD-10-CM | POA: Diagnosis not present

## 2017-07-15 ENCOUNTER — Ambulatory Visit (INDEPENDENT_AMBULATORY_CARE_PROVIDER_SITE_OTHER): Payer: 59 | Admitting: Obstetrics & Gynecology

## 2017-07-15 ENCOUNTER — Encounter: Payer: Self-pay | Admitting: Obstetrics & Gynecology

## 2017-07-15 VITALS — BP 100/70 | Wt 172.0 lb

## 2017-07-15 DIAGNOSIS — N926 Irregular menstruation, unspecified: Secondary | ICD-10-CM | POA: Diagnosis not present

## 2017-07-15 LAB — OB RESULTS CONSOLE VARICELLA ZOSTER ANTIBODY, IGG: VARICELLA IGG: IMMUNE

## 2017-07-15 NOTE — Progress Notes (Signed)
07/15/2017   Chief Complaint: Missed period  Transfer of Care Patient: no.  IVF patient from [facility] Dr April Manson   History of Present Illness: Ms. Funderburke is a 27 y.o. G1P0 [redacted]w[redacted]d based on Patient's last menstrual period was 05/03/2017 (exact date). with an Estimated Date of Delivery: 02/07/18, with the above CC.   Her periods were: regular periods every 28 days She was using no method when she conceived.  She has Positive signs or symptoms of nausea/vomiting of pregnancy. She has Negative signs or symptoms of miscarriage or preterm labor She identifies Negative Zika risk factors for her and her partner On any different medications around the time she conceived/early pregnancy: Yes - Metformin for PCOS; IVF for pregnancy History of varicella: Yes   ROS: A 12-point review of systems was performed and negative, except as stated in the above HPI.  OBGYN History: As per HPI. OB History  Gravida Para Term Preterm AB Living  1            SAB TAB Ectopic Multiple Live Births               # Outcome Date GA Lbr Len/2nd Weight Sex Delivery Anes PTL Lv  1 Current             Any issues with any prior pregnancies: infertility- female factor Any prior children are healthy, doing well, without any problems or issues: Miscarriage in DEC 2018 with first round of IVF History of pap smears: Yes. Last pap smear 2018. Abnormal: no  History of STIs: No   Past Medical History: Past Medical History:  Diagnosis Date  . Headache     Past Surgical History: Past Surgical History:  Procedure Laterality Date  . CHOLECYSTECTOMY N/A 02/22/2014   Procedure: LAPAROSCOPIC CHOLECYSTECTOMY WITH INTRAOPERATIVE CHOLANGIOGRAM;  Surgeon: Chevis Pretty III, MD;  Location: [facility] OR;  Service: General;  Laterality: N/A;  . EYE SURGERY     lasik  2014    Family History:  Family History  Problem Relation Age of Onset  . Cancer Mother   . Skin cancer Mother   . Cancer Maternal Grandmother   . Breast cancer  Maternal Grandmother   . Skin cancer Maternal Grandmother   . Cancer Paternal Grandfather   . Stomach cancer Paternal Grandfather    She denies any female cancers, bleeding or blood clotting disorders.  She denies any history of mental retardation, birth defects or genetic disorders in her or the FOB's history  Social History:  Social History   Socioeconomic History  . Marital status: Married    Spouse name: Not on file  . Number of children: Not on file  . Years of education: Not on file  . Highest education level: Not on file  Occupational History  . Not on file  Social Needs  . Financial resource strain: Not on file  . Food insecurity:    Worry: Not on file    Inability: Not on file  . Transportation needs:    Medical: Not on file    Non-medical: Not on file  Tobacco Use  . Smoking status: Never Smoker  . Smokeless tobacco: Never Used  Substance and Sexual Activity  . Alcohol use: Yes    Alcohol/week: 0.6 oz    Types: 1 Cans of beer per week    Comment: occasionally  . Drug use: No  . Sexual activity: Yes  Lifestyle  . Physical activity:    Days per week: Not on file  Minutes per session: Not on file  . Stress: Not on file  Relationships  . Social connections:    Talks on phone: Not on file    Gets together: Not on file    Attends religious service: Not on file    Active member of club or organization: Not on file    Attends meetings of clubs or organizations: Not on file    Relationship status: Not on file  . Intimate partner violence:    Fear of current or ex partner: Not on file    Emotionally abused: Not on file    Physically abused: Not on file    Forced sexual activity: Not on file  Other Topics Concern  . Not on file  Social History Narrative  . Not on file   Any pets in the household: no  Allergy: Allergies  Allergen Reactions  . Penicillins Hives    Current Outpatient Medications:  Current Outpatient Medications:  .  doxylamine,  Sleep, (UNISOM) 25 MG tablet, Take 25 mg by mouth at bedtime as needed., Disp: , Rfl:  .  metFORMIN (GLUCOPHAGE) 1000 MG tablet, , Disp: , Rfl: 11 .  Prenatal Vit-Fe Fumarate-FA (PRENATAL MULTIVITAMIN) TABS tablet, Take 1 tablet by mouth daily at 12 noon., Disp: , Rfl:  .  progesterone (PROMETRIUM) 200 MG capsule, , Disp: , Rfl: 0 .  pyridoxine (B-6) 100 MG tablet, Take 100 mg by mouth daily., Disp: , Rfl:  .  Doxylamine-Pyridoxine (DICLEGIS) 10-10 MG TBEC, Take 2 tablets by mouth at bedtime., Disp: , Rfl:  .  ondansetron (ZOFRAN) 8 MG tablet, Take 4 mg by mouth every 8 (eight) hours as needed for nausea or vomiting. , Disp: , Rfl:  .  oxyCODONE-acetaminophen (ROXICET) 5-325 MG per tablet, Take 1-2 tablets by mouth every 4 (four) hours as needed. (Patient not taking: Reported on 07/15/2017), Disp: 50 tablet, Rfl: 0 .  promethazine (PHENERGAN) 12.5 MG tablet, Take 12.5 mg by mouth every 6 (six) hours as needed for nausea or vomiting., Disp: , Rfl:    Physical Exam:   BP 100/70   Wt 172 lb (78 kg)   LMP 05/03/2017 (Exact Date)   BMI 27.76 kg/m  Body mass index is 27.76 kg/m. Constitutional: Well nourished, well developed female in no acute distress.  Neck:  Supple, normal appearance, and no thyromegaly  Cardiovascular: S1, S2 normal, no murmur, rub or gallop, regular rate and rhythm Respiratory:  Clear to auscultation bilateral. Normal respiratory effort Abdomen: positive bowel sounds and no masses, hernias; diffusely non tender to palpation, non distended Breasts: breasts appear normal, no suspicious masses, no skin or nipple changes or axillary nodes. Neuro/Psych:  Normal mood and affect.  Skin:  Warm and dry.  Lymphatic:  No inguinal lymphadenopathy.   Pelvic exam: is not limited by body habitus EGBUS: within normal limits, Vagina: within normal limits and with no blood in the vault, Cervix: normal appearing cervix without discharge or lesions, closed/long/high, Uterus:  enlarged: 10  weeks, and Adnexa:  normal adnexa  Assessment: Ms. Breed is a 27 y.o. G1P0 [redacted]w[redacted]d based on Patient's last menstrual period was 05/03/2017 (exact date). with an Estimated Date of Delivery: 02/05/18 based on IVF date,  for prenatal care.  Plan:  1) Avoid alcoholic beverages. 2) Patient encouraged not to smoke.  3) Discontinue the use of all non-medicinal drugs and chemicals.  4) Take prenatal vitamins daily.  5) Seatbelt use advised 6) Nutrition, food safety (fish, cheese advisories, and high nitrite foods)  and exercise discussed. 7) Hospital and [facility] discussed  8) Patient is asked about travel to areas at risk for the Zika virus, and counseled to avoid travel and exposure to mosquitoes or sexual partners who may have themselves been exposed to the virus. Testing is discussed, and will be ordered as appropriate.  9) [facility] advised 10) Genetic Screening, such as with 1st Trimester Screening, cell free fetal DNA, AFP testing, and Ultrasound, as well as with amniocentesis and CVS as appropriate, is discussed with patient. She plans to have not genetic testing this pregnancy. 11) Stop Metformin as has side effects 12) Glucola soon due to h/o PCOS  Problem list reviewed and updated.  Annamarie MajorPaul Dymond Gutt, MD, Merlinda FrederickFACOG [facility], [facility] 07/15/2017  4:00 PM

## 2017-07-15 NOTE — Patient Instructions (Signed)
First Trimester of Pregnancy The first trimester of pregnancy is from week 1 until the end of week 13 (months 1 through 3). A week after a sperm fertilizes an egg, the egg will implant on the wall of the uterus. This embryo will begin to develop into a baby. Genes from you and your partner will form the baby. The female genes will determine whether the baby will be a boy or a girl. At 6-8 weeks, the eyes and face will be formed, and the heartbeat can be seen on ultrasound. At the end of 12 weeks, all the baby's organs will be formed. Now that you are pregnant, you will want to do everything you can to have a healthy baby. Two of the most important things are to get good prenatal care and to follow your health care provider's instructions. Prenatal care is all the medical care you receive before the baby's birth. This care will help prevent, find, and treat any problems during the pregnancy and childbirth. Body changes during your first trimester Your body goes through many changes during pregnancy. The changes vary from woman to woman.  You may gain or lose a couple of pounds at first.  You may feel sick to your stomach (nauseous) and you may throw up (vomit). If the vomiting is uncontrollable, call your health care provider.  You may tire easily.  You may develop headaches that can be relieved by medicines. All medicines should be approved by your health care provider.  You may urinate more often. Painful urination may mean you have a bladder infection.  You may develop heartburn as a result of your pregnancy.  You may develop constipation because certain hormones are causing the muscles that push stool through your intestines to slow down.  You may develop hemorrhoids or swollen veins (varicose veins).  Your breasts may begin to grow larger and become tender. Your nipples may stick out more, and the tissue that surrounds them (areola) may become darker.  Your gums may bleed and may be  sensitive to brushing and flossing.  Dark spots or blotches (chloasma, mask of pregnancy) may develop on your face. This will likely fade after the baby is born.  Your menstrual periods will stop.  You may have a loss of appetite.  You may develop cravings for certain kinds of food.  You may have changes in your emotions from day to day, such as being excited to be pregnant or being concerned that something may go wrong with the pregnancy and baby.  You may have more vivid and strange dreams.  You may have changes in your hair. These can include thickening of your hair, rapid growth, and changes in texture. Some women also have hair loss during or after pregnancy, or hair that feels dry or thin. Your hair will most likely return to normal after your baby is born.  What to expect at prenatal visits During a routine prenatal visit:  You will be weighed to make sure you and the baby are growing normally.  Your blood pressure will be taken.  Your abdomen will be measured to track your baby's growth.  The fetal heartbeat will be listened to between weeks 10 and 14 of your pregnancy.  Test results from any previous visits will be discussed.  Your health care provider may ask you:  How you are feeling.  If you are feeling the baby move.  If you have had any abnormal symptoms, such as leaking fluid, bleeding, severe headaches,   or abdominal cramping.  If you are using any tobacco products, including cigarettes, chewing tobacco, and electronic cigarettes.  If you have any questions.  Other tests that may be performed during your first trimester include:  Blood tests to find your blood type and to check for the presence of any previous infections. The tests will also be used to check for low iron levels (anemia) and protein on red blood cells (Rh antibodies). Depending on your risk factors, or if you previously had diabetes during pregnancy, you may have tests to check for high blood  sugar that affects pregnant women (gestational diabetes).  Urine tests to check for infections, diabetes, or protein in the urine.  An ultrasound to confirm the proper growth and development of the baby.  Fetal screens for spinal cord problems (spina bifida) and Down syndrome.  HIV (human immunodeficiency virus) testing. Routine prenatal testing includes screening for HIV, unless you choose not to have this test.  You may need other tests to make sure you and the baby are doing well.  Follow these instructions at home: Medicines  Follow your health care provider's instructions regarding medicine use. Specific medicines may be either safe or unsafe to take during pregnancy.  Take a prenatal vitamin that contains at least 600 micrograms (mcg) of folic acid.  If you develop constipation, try taking a stool softener if your health care provider approves. Eating and drinking  Eat a balanced diet that includes fresh fruits and vegetables, whole grains, good sources of protein such as meat, eggs, or tofu, and low-fat dairy. Your health care provider will help you determine the amount of weight gain that is right for you.  Avoid raw meat and uncooked cheese. These carry germs that can cause birth defects in the baby.  Eating four or five small meals rather than three large meals a day may help relieve nausea and vomiting. If you start to feel nauseous, eating a few soda crackers can be helpful. Drinking liquids between meals, instead of during meals, also seems to help ease nausea and vomiting.  Limit foods that are high in fat and processed sugars, such as fried and sweet foods.  To prevent constipation: ? Eat foods that are high in fiber, such as fresh fruits and vegetables, whole grains, and beans. ? Drink enough fluid to keep your urine clear or pale yellow. Activity  Exercise only as directed by your health care provider. Most women can continue their usual exercise routine during  pregnancy. Try to exercise for 30 minutes at least 5 days a week. Exercising will help you: ? Control your weight. ? Stay in shape. ? Be prepared for labor and delivery.  Experiencing pain or cramping in the lower abdomen or lower back is a good sign that you should stop exercising. Check with your health care provider before continuing with normal exercises.  Try to avoid standing for long periods of time. Move your legs often if you must stand in one place for a long time.  Avoid heavy lifting.  Wear low-heeled shoes and practice good posture.  You may continue to have sex unless your health care provider tells you not to. Relieving pain and discomfort  Wear a good support bra to relieve breast tenderness.  Take warm sitz baths to soothe any pain or discomfort caused by hemorrhoids. Use hemorrhoid cream if your health care provider approves.  Rest with your legs elevated if you have leg cramps or low back pain.  If you develop   varicose veins in your legs, wear support hose. Elevate your feet for 15 minutes, 3-4 times a day. Limit salt in your diet. Prenatal care  Schedule your prenatal visits by the twelfth week of pregnancy. They are usually scheduled monthly at first, then more often in the last 2 months before delivery.  Write down your questions. Take them to your prenatal visits.  Keep all your prenatal visits as told by your health care provider. This is important. Safety  Wear your seat belt at all times when driving.  Make a list of emergency phone numbers, including numbers for family, friends, the hospital, and police and fire departments. General instructions  Ask your health care provider for a referral to a local prenatal education class. Begin classes no later than the beginning of month 6 of your pregnancy.  Ask for help if you have counseling or nutritional needs during pregnancy. Your health care provider can offer advice or refer you to specialists for help  with various needs.  Do not use hot tubs, steam rooms, or saunas.  Do not douche or use tampons or scented sanitary pads.  Do not cross your legs for long periods of time.  Avoid cat litter boxes and soil used by cats. These carry germs that can cause birth defects in the baby and possibly loss of the fetus by miscarriage or stillbirth.  Avoid all smoking, herbs, alcohol, and medicines not prescribed by your health care provider. Chemicals in these products affect the formation and growth of the baby.  Do not use any products that contain nicotine or tobacco, such as cigarettes and e-cigarettes. If you need help quitting, ask your health care provider. You may receive counseling support and other resources to help you quit.  Schedule a dentist appointment. At home, brush your teeth with a soft toothbrush and be gentle when you floss. Contact a health care provider if:  You have dizziness.  You have mild pelvic cramps, pelvic pressure, or nagging pain in the abdominal area.  You have persistent nausea, vomiting, or diarrhea.  You have a bad smelling vaginal discharge.  You have pain when you urinate.  You notice increased swelling in your face, hands, legs, or ankles.  You are exposed to fifth disease or chickenpox.  You are exposed to German measles (rubella) and have never had it. Get help right away if:  You have a fever.  You are leaking fluid from your vagina.  You have spotting or bleeding from your vagina.  You have severe abdominal cramping or pain.  You have rapid weight gain or loss.  You vomit blood or material that looks like coffee grounds.  You develop a severe headache.  You have shortness of breath.  You have any kind of trauma, such as from a fall or a car accident. Summary  The first trimester of pregnancy is from week 1 until the end of week 13 (months 1 through 3).  Your body goes through many changes during pregnancy. The changes vary from  woman to woman.  You will have routine prenatal visits. During those visits, your health care provider will examine you, discuss any test results you may have, and talk with you about how you are feeling. This information is not intended to replace advice given to you by your health care provider. Make sure you discuss any questions you have with your health care provider. Document Released: 03/13/2001 Document Revised: 02/29/2016 Document Reviewed: 02/29/2016 Elsevier Interactive Patient Education  2018 [employer]  

## 2017-07-16 ENCOUNTER — Emergency Department
Admission: EM | Admit: 2017-07-16 | Discharge: 2017-07-16 | Disposition: A | Discharge status: Home / Self Care | Payer: 59 | Attending: Emergency Medicine | Admitting: Emergency Medicine

## 2017-07-16 ENCOUNTER — Encounter: Payer: Self-pay | Admitting: Emergency Medicine

## 2017-07-16 ENCOUNTER — Emergency Department: Payer: 59

## 2017-07-16 ENCOUNTER — Other Ambulatory Visit: Payer: Self-pay

## 2017-07-16 DIAGNOSIS — O9A311 Physical abuse complicating pregnancy, first trimester: Secondary | ICD-10-CM | POA: Diagnosis not present

## 2017-07-16 DIAGNOSIS — R1030 Lower abdominal pain, unspecified: Secondary | ICD-10-CM | POA: Diagnosis not present

## 2017-07-16 DIAGNOSIS — O9989 Other specified diseases and conditions complicating pregnancy, childbirth and the puerperium: Secondary | ICD-10-CM | POA: Insufficient documentation

## 2017-07-16 DIAGNOSIS — Z7984 Long term (current) use of oral hypoglycemic drugs: Secondary | ICD-10-CM | POA: Insufficient documentation

## 2017-07-16 DIAGNOSIS — Z3A11 11 weeks gestation of pregnancy: Secondary | ICD-10-CM | POA: Insufficient documentation

## 2017-07-16 DIAGNOSIS — O26891 Other specified pregnancy related conditions, first trimester: Secondary | ICD-10-CM | POA: Diagnosis not present

## 2017-07-16 DIAGNOSIS — R109 Unspecified abdominal pain: Secondary | ICD-10-CM | POA: Insufficient documentation

## 2017-07-16 LAB — RPR+RH+ABO+RUB AB+AB SCR+CB...
Antibody Screen: NEGATIVE
HEMATOCRIT: 38.3 % (ref 34.0–46.6)
HIV Screen 4th Generation wRfx: NONREACTIVE
Hemoglobin: 12.8 g/dL (ref 11.1–15.9)
Hepatitis B Surface Ag: NEGATIVE
MCH: 26.2 pg — ABNORMAL LOW (ref 26.6–33.0)
MCHC: 33.4 g/dL (ref 31.5–35.7)
MCV: 79 fL (ref 79–97)
Platelets: 270 10*3/uL (ref 150–379)
RBC: 4.88 x10E6/uL (ref 3.77–5.28)
RDW: 14.7 % (ref 12.3–15.4)
RH TYPE: POSITIVE
RPR Ser Ql: NONREACTIVE
Rubella Antibodies, IGG: 1.83 index (ref >0.99)
Varicella zoster IgG: 2222 index (ref >165)
WBC: 10 10*3/uL (ref 3.4–10.8)

## 2017-07-16 LAB — POCT PREGNANCY, URINE: PREG TEST UR: POSITIVE — AB

## 2017-07-16 LAB — HCG, QUANTITATIVE, PREGNANCY: hCG, Beta Chain, Quant, S: 72165 m[IU]/mL — ABNORMAL HIGH (ref <5)
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
## 2017-07-16 NOTE — ED Notes (Signed)
Blank Note:  Patient to Room 18, Helmut MusterAlicia RN aware of placement.

## 2017-07-16 NOTE — ED Notes (Signed)

## 2017-07-17 LAB — IGP,CTNGTV,RFX APTIMA HPV ASCU
Chlamydia, Nuc. Acid Amp: NEGATIVE
Gonococcus, Nuc. Acid Amp: NEGATIVE
PAP SMEAR COMMENT: 0
Trich vag by NAA: NEGATIVE

## 2017-07-17 LAB — URINE CULTURE

## 2017-07-24 ENCOUNTER — Ambulatory Visit (INDEPENDENT_AMBULATORY_CARE_PROVIDER_SITE_OTHER): Payer: 59 | Admitting: Obstetrics and Gynecology

## 2017-07-24 ENCOUNTER — Other Ambulatory Visit: Payer: 59

## 2017-07-24 ENCOUNTER — Encounter: Payer: Self-pay | Admitting: Obstetrics and Gynecology

## 2017-07-24 ENCOUNTER — Ambulatory Visit (INDEPENDENT_AMBULATORY_CARE_PROVIDER_SITE_OTHER): Payer: 59

## 2017-07-24 VITALS — BP 104/64 | Wt 170.0 lb

## 2017-07-24 DIAGNOSIS — N926 Irregular menstruation, unspecified: Secondary | ICD-10-CM

## 2017-07-24 DIAGNOSIS — Z3401 Encounter for supervision of normal first pregnancy, first trimester: Secondary | ICD-10-CM | POA: Insufficient documentation

## 2017-07-24 DIAGNOSIS — Z3A12 12 weeks gestation of pregnancy: Secondary | ICD-10-CM

## 2017-07-24 NOTE — Progress Notes (Signed)
    Routine Prenatal Care Visit  Subjective  Jessica Johnson is a 27 y.o. G1P0 at 7639w0d being seen today for ongoing prenatal care.  She is currently monitored for the following issues for this low-risk pregnancy and has Encounter for supervision of normal first pregnancy in first trimester on their problem list.  ----------------------------------------------------------------------------------- Patient reports no complaints.   Contractions: Not present. Vag. Bleeding: None.   . Denies leaking of fluid.  ----------------------------------------------------------------------------------- The following portions of the patient's history were reviewed and updated as appropriate: allergies, current medications, past family history, past medical history, past social history, past surgical history and problem list. Problem list updated.   Objective  Blood pressure 104/64, weight 170 lb (77.1 kg), last menstrual period 05/03/2017. Pregravid weight 168 lb (76.2 kg) Total Weight Gain 2 lb (0.907 kg) Urinalysis: Urine Protein: Negative Urine Glucose: Negative  Fetal Status: Fetal Heart Rate (bpm): 159         General:  Alert, oriented and cooperative. Patient is in no acute distress.  Skin: Skin is warm and dry. No rash noted.   Cardiovascular: Normal heart rate noted  Respiratory: Normal respiratory effort, no problems with respiration noted  Abdomen: Soft, gravid, appropriate for gestational age. Pain/Pressure: Absent     Pelvic:  Cervical exam deferred        Extremities: Normal range of motion.     ental Status: Normal mood and affect. Normal behavior. Normal judgment and thought content.     Assessment   27 y.o. G1P0 at 1739w0d by  02/05/2018, by Est. Date of Conception presenting for routine prenatal visit  Plan   pregnancy2 Problems (from 05/03/17 to present)    Problem Noted Resolved   Encounter for supervision of normal first pregnancy in first trimester 07/24/2017 by Natale MilchSchuman,  Briseyda Fehr R, MD No   Overview Signed 07/24/2017 10:18 AM by Natale MilchSchuman, Peyton Rossner R, MD      Clinic [facility] Prenatal Labs  Dating  IVF Blood type: A/Positive/-- (04/15 1626)   Genetic Screen Declines Antibody:Negative (04/15 1626)  Anatomic US  Rubella: 1.83 (04/15 1626) Varicella: Immune  GTT Early: collected 07/24/17       28 wk:      RPR: Non Reactive (04/15 1626)   Rhogam  HBsAg: Negative (04/15 1626)   TDaP vaccine                       HIV: Non Reactive (04/15 1626)   Flu Shot                                GBS:   Contraception  Pap: 2019 NIL  CBB     CS/VBAC    Baby Food    Support Person                 Gestational age appropriate obstetric precautions including but not limited to vaginal bleeding, contractions, leaking of fluid and fetal movement were reviewed in detail with the patient.    [facility] prenatal class pamphlet given to patient Declines genetic screening Early 1 hour GTT today Return in about 1 month (around 08/21/2017) for ROB.  Adelene Idlerhristanna Alexxis Mackert MD  [facility], [facility] 07/24/2017, 10:18 AM

## 2017-07-24 NOTE — Progress Notes (Signed)
Pt reports no problems. Dating scan today to confirm with IVF.

## 2017-07-25 LAB — GLUCOSE TOLERANCE, 1 HOUR: Glucose, 1Hr PP: 116 mg/dL (ref 65–199)

## 2017-08-21 ENCOUNTER — Encounter: Payer: Self-pay | Admitting: Maternal Newborn

## 2017-08-21 ENCOUNTER — Ambulatory Visit (INDEPENDENT_AMBULATORY_CARE_PROVIDER_SITE_OTHER): Payer: 59 | Admitting: Maternal Newborn

## 2017-08-21 VITALS — BP 104/64 | Wt 173.0 lb

## 2017-08-21 DIAGNOSIS — Z3689 Encounter for other specified antenatal screening: Secondary | ICD-10-CM

## 2017-08-21 DIAGNOSIS — O26891 Other specified pregnancy related conditions, first trimester: Secondary | ICD-10-CM

## 2017-08-21 DIAGNOSIS — Z3A16 16 weeks gestation of pregnancy: Secondary | ICD-10-CM

## 2017-08-21 DIAGNOSIS — Z3401 Encounter for supervision of normal first pregnancy, first trimester: Secondary | ICD-10-CM

## 2017-08-21 DIAGNOSIS — R12 Heartburn: Secondary | ICD-10-CM | POA: Insufficient documentation

## 2017-08-21 MED ORDER — OMEPRAZOLE 40 MG PO CPDR: 40.0000 mg | DELAYED_RELEASE_CAPSULE | Freq: Every day | ORAL | 6 refills | Status: DC

## 2017-08-21 NOTE — Patient Instructions (Signed)

## 2017-08-21 NOTE — Progress Notes (Signed)
    Routine Prenatal Care Visit  Subjective  Jessica Johnson is a 27 y.o. G1P0 at [redacted]w[redacted]d being seen today for ongoing prenatal care.  She is currently monitored for the following issues for this low-risk pregnancy and has Encounter for supervision of normal first pregnancy in first trimester on their problem list.  ----------------------------------------------------------------------------------- Patient reports heartburn. Has tried Zantac without relief. Occasional cramps/pains when changing positions. Contractions: Not present. Vag. Bleeding: None.   ----------------------------------------------------------------------------------- The following portions of the patient's history were reviewed and updated as appropriate: allergies, current medications, past family history, past medical history, past social history, past surgical history and problem list. Problem list updated.  Objective  Blood pressure 104/64, weight 173 lb (78.5 kg), last menstrual period 05/03/2017. Pregravid weight 168 lb (76.2 kg) Total Weight Gain 5 lb (2.268 kg) Urinalysis: Urine Protein: Negative Urine Glucose: Negative  Fetal Status: Fetal Heart Rate (bpm): 150         General:  Alert, oriented and cooperative. Patient is in no acute distress.  Skin: Skin is warm and dry. No rash noted.   Cardiovascular: Normal heart rate noted  Respiratory: Normal respiratory effort, no problems with respiration noted  Abdomen: Soft, gravid, appropriate for gestational age. Pain/Pressure: Absent     Pelvic:  Cervical exam deferred        Extremities: Normal range of motion.     Mental Status: Normal mood and affect. Normal behavior. Normal judgment and thought content.     Assessment   27 y.o. G1P0 at [redacted]w[redacted]d, EDD 02/05/2018 by Est. Date of Conception presenting for routine prenatal visit.  Plan   pregnancy2 Problems (from 05/03/17 to present)    Problem Noted Resolved   Encounter for supervision of normal first pregnancy  in first trimester 07/24/2017 by Natale Milch, MD No   Overview Signed 07/24/2017 10:18 AM by Natale Milch, MD      Clinic [facility] Prenatal Labs  Dating  IVF Blood type: A/Positive/-- (04/15 1626)   Genetic Screen Declines Antibody:Negative (04/15 1626)  Anatomic [place]  Rubella: 1.83 (04/15 1626) Varicella: Immune  GTT Early: collected 07/24/17       28 wk:      RPR: Non Reactive (04/15 1626)   Rhogam  HBsAg: Negative (04/15 1626)   TDaP vaccine                       HIV: Non Reactive (04/15 1626)   Flu Shot                                GBS:   Contraception  Pap: 2019 NIL  CBB     CS/VBAC    Baby Food    Support Person              Rx sent for Prilosec for better control of heartburn symptoms.  Please refer to After Visit Summary for other counseling recommendations.   Return in about 1 month (around 09/18/2017) for ROB and anatomy scan.  Marcelyn Bruins, CNM 08/21/2017  3:50 PM

## 2017-08-21 NOTE — Progress Notes (Signed)
Pt c/o reflux, interested in switching to prilosec instead of using zantac.

## 2017-09-30 ENCOUNTER — Other Ambulatory Visit: Payer: 59

## 2017-09-30 ENCOUNTER — Encounter: Payer: 59 | Admitting: Certified Nurse Midwife

## 2017-10-02 ENCOUNTER — Ambulatory Visit (INDEPENDENT_AMBULATORY_CARE_PROVIDER_SITE_OTHER): Payer: 59 | Admitting: Maternal Newborn

## 2017-10-02 ENCOUNTER — Ambulatory Visit (INDEPENDENT_AMBULATORY_CARE_PROVIDER_SITE_OTHER): Payer: 59

## 2017-10-02 ENCOUNTER — Encounter: Payer: Self-pay | Admitting: Maternal Newborn

## 2017-10-02 VITALS — BP 110/70 | Wt 182.0 lb

## 2017-10-02 DIAGNOSIS — Z363 Encounter for antenatal screening for malformations: Secondary | ICD-10-CM | POA: Diagnosis not present

## 2017-10-02 DIAGNOSIS — Z3689 Encounter for other specified antenatal screening: Secondary | ICD-10-CM

## 2017-10-02 DIAGNOSIS — Z3401 Encounter for supervision of normal first pregnancy, first trimester: Secondary | ICD-10-CM

## 2017-10-02 DIAGNOSIS — Z3A22 22 weeks gestation of pregnancy: Secondary | ICD-10-CM

## 2017-10-02 NOTE — Progress Notes (Signed)
    Routine Prenatal Care Visit  Subjective  Jessica Johnson is a 27 y.o. G1P0 at 617w0d being seen today for ongoing prenatal care.  She is currently monitored for the following issues for this low-risk pregnancy and has Encounter for supervision of normal first pregnancy in first trimester and Heartburn in pregnancy in first trimester on their problem list.  ----------------------------------------------------------------------------------- Patient reports occasional backache.   Contractions: Not present. Vag. Bleeding: None.  Movement: Present. No leaking of fluid.  ----------------------------------------------------------------------------------- The following portions of the patient's history were reviewed and updated as appropriate: allergies, current medications, past family history, past medical history, past social history, past surgical history and problem list. Problem list updated.   Objective  Blood pressure 110/70, weight 182 lb (82.6 kg), last menstrual period 05/03/2017. Pregravid weight 168 lb (76.2 kg) Total Weight Gain 14 lb (6.35 kg) Urinalysis: Urine Protein: Negative Urine Glucose: Negative  Fetal Status: Fetal Heart Rate (bpm): 160 Fundal Height: 22 cm Movement: Present     General:  Alert, oriented and cooperative. Patient is in no acute distress.  Skin: Skin is warm and dry. No rash noted.   Cardiovascular: Normal heart rate noted  Respiratory: Normal respiratory effort, no problems with respiration noted  Abdomen: Soft, gravid, appropriate for gestational age. Pain/Pressure: Absent     Pelvic:  Cervical exam deferred        Extremities: Normal range of motion.  Edema: None  Mental Status: Normal mood and affect. Normal behavior. Normal judgment and thought content.     Assessment   27 y.o. G1P0 at 3517w0d, EDD 02/05/2018 by Est. Date of Conception presenting for routine prenatal visit.  Plan   pregnancy2 Problems (from 05/03/17 to present)    Problem Noted  Resolved   Encounter for supervision of normal first pregnancy in first trimester 07/24/2017 by Jessica Johnson No   Overview Signed 07/24/2017 10:18 AM by Jessica Johnson      Clinic [facility] Prenatal Labs  Dating  IVF Blood type: A/Positive/-- (04/15 1626)   Genetic Screen Declines Antibody:Negative (04/15 1626)  Anatomic US  Rubella: 1.83 (04/15 1626) Varicella: Immune  GTT Early: collected 07/24/17       28 wk:      RPR: Non Reactive (04/15 1626)   Rhogam  HBsAg: Negative (04/15 1626)   TDaP vaccine                       HIV: Non Reactive (04/15 1626)   Flu Shot                                GBS:   Contraception  Pap: 2019 NIL  CBB     CS/VBAC    Baby Food    Support Person              Discussed using a pregnancy support band to help with backache.  Anatomy scan today normal but incomplete for right upper extremity and spine. Repeat in 4 weeks.  Gestational age appropriate obstetric precautions were reviewed.  Return in about 1 month (around 10/30/2017) for ROB and follow up anatomy scan.  Jessica Johnson 10/02/2017  11:29 AM

## 2017-10-02 NOTE — Progress Notes (Signed)
No concerns.rj 

## 2017-11-01 ENCOUNTER — Encounter: Payer: Self-pay | Admitting: Maternal Newborn

## 2017-11-01 ENCOUNTER — Ambulatory Visit (INDEPENDENT_AMBULATORY_CARE_PROVIDER_SITE_OTHER): Payer: 59 | Admitting: Maternal Newborn

## 2017-11-01 ENCOUNTER — Ambulatory Visit (INDEPENDENT_AMBULATORY_CARE_PROVIDER_SITE_OTHER): Payer: 59

## 2017-11-01 VITALS — BP 110/70 | Wt 193.0 lb

## 2017-11-01 DIAGNOSIS — Z3A26 26 weeks gestation of pregnancy: Secondary | ICD-10-CM

## 2017-11-01 DIAGNOSIS — Z3689 Encounter for other specified antenatal screening: Secondary | ICD-10-CM

## 2017-11-01 DIAGNOSIS — Z3402 Encounter for supervision of normal first pregnancy, second trimester: Secondary | ICD-10-CM

## 2017-11-01 DIAGNOSIS — Z3401 Encounter for supervision of normal first pregnancy, first trimester: Secondary | ICD-10-CM

## 2017-11-01 NOTE — Progress Notes (Signed)
    Routine Prenatal Care Visit  Subjective  Jessica Johnson is a 27 y.o. G1P0 at 4936w2d being seen today for ongoing prenatal care.  She is currently monitored for the following issues for this low-risk pregnancy and has Encounter for supervision of normal first pregnancy in first trimester and Heartburn in pregnancy in first trimester on their problem list.  ----------------------------------------------------------------------------------- Patient reports no complaints.   Contractions: Not present. Vag. Bleeding: None.  Movement: Present. No leaking of fluid.  ----------------------------------------------------------------------------------- The following portions of the patient's history were reviewed and updated as appropriate: allergies, current medications, past family history, past medical history, past social history, past surgical history and problem list. Problem list updated.  Objective  Blood pressure 110/70, weight 193 lb (87.5 kg), last menstrual period 05/03/2017. Pregravid weight 168 lb (76.2 kg) Total Weight Gain 25 lb (11.3 kg) Urinalysis: Urine Protein: Negative Urine Glucose: Negative  Fetal Status: Fetal Heart Rate (bpm): 137 Fundal Height: 26 cm Movement: Present  Presentation: Jessica Johnson Breech  General:  Alert, oriented and cooperative. Patient is in no acute distress.  Skin: Skin is warm and dry. No rash noted.   Cardiovascular: Normal heart rate noted  Respiratory: Normal respiratory effort, no problems with respiration noted  Abdomen: Soft, gravid, appropriate for gestational age. Pain/Pressure: Absent     Pelvic:  Cervical exam deferred        Extremities: Normal range of motion.  Edema: None  Mental Status: Normal mood and affect. Normal behavior. Normal judgment and thought content.     Assessment   27 y.o. G1P0 at 1236w2d, EDD 02/05/2018 by Est. Date of Conception presenting for routine prenatal visit.  Plan   pregnancy2 Problems (from 05/03/17 to present)    Problem Noted Resolved   Encounter for supervision of normal first pregnancy in first trimester 07/24/2017 by Natale MilchSchuman, Christanna R, MD No   Overview Signed 07/24/2017 10:18 AM by Natale MilchSchuman, Christanna R, MD      Clinic [facility] Prenatal Labs  Dating  IVF Blood type: A/Positive/-- (04/15 1626)   Genetic Screen Declines Antibody:Negative (04/15 1626)  Anatomic US Complete 11/01/17 Rubella: 1.83 (04/15 1626) Varicella: Immune  GTT Early: collected 07/24/17       28 wk:      RPR: Non Reactive (04/15 1626)   Rhogam N/A HBsAg: Negative (04/15 1626)   TDaP vaccine                       HIV: Non Reactive (04/15 1626)   Flu Shot                                GBS:   Contraception  Pap: 2019 NIL  CBB     CS/VBAC    Baby Food    Support Person              Anatomy scan complete and normal today. Breech presentation.   Return in about 2 weeks (around 11/15/2017) for ROB and GTT/28 week labs.  Marcelyn BruinsJacelyn Kenya Johnson, CNM 11/01/2017  3:26 PM

## 2017-11-15 ENCOUNTER — Other Ambulatory Visit: Payer: 59

## 2017-11-15 ENCOUNTER — Ambulatory Visit: Payer: 59 | Admitting: Obstetrics & Gynecology

## 2017-11-15 VITALS — BP 110/70 | Wt 193.0 lb

## 2017-11-15 DIAGNOSIS — Z3401 Encounter for supervision of normal first pregnancy, first trimester: Secondary | ICD-10-CM

## 2017-11-15 DIAGNOSIS — Z3A28 28 weeks gestation of pregnancy: Secondary | ICD-10-CM

## 2017-11-15 DIAGNOSIS — Z3403 Encounter for supervision of normal first pregnancy, third trimester: Secondary | ICD-10-CM

## 2017-11-15 LAB — POCT URINALYSIS DIPSTICK OB
Blood, UA: NEGATIVE
Glucose, UA: NEGATIVE — AB

## 2017-11-15 NOTE — Progress Notes (Signed)
Prenatal Visit Note Date: 11/15/2017 Clinic: [facility]  Subjective:  Jessica Johnson is a 27 y.o. G1P0 at 10668w2d being seen today for ongoing prenatal care.  She is currently monitored for the following issues for this high-risk pregnancy and has Encounter for supervision of normal first pregnancy in first trimester and IVF pregnancy due to both PCOS and female factor.  Patient reports no complaints.   Contractions: Not present.  .  Movement: Present. Denies leaking of fluid.   The following portions of the patient's history were reviewed and updated as appropriate: allergies, current medications, past family history, past medical history, past social history, past surgical history and problem list. Problem list updated.  Objective:   Vitals:   11/15/17 0815  BP: 110/70  Weight: 193 lb (87.5 kg)    Fetal Status:     Movement: Present     General:  Alert, oriented and cooperative. Patient is in no acute distress.  Skin: Skin is warm and dry. No rash noted.   Cardiovascular: Normal heart rate noted  Respiratory: Normal respiratory effort, no problems with respiration noted  Abdomen: Soft, gravid, appropriate for gestational age. Pain/Pressure: Absent     Pelvic:  Cervical exam deferred        Extremities: Normal range of motion.     Mental Status: Normal mood and affect. Normal behavior. Normal judgment and thought content.   Urinalysis:      Assessment and Plan:  Pregnancy: G1P0 at 6468w2d  1. [redacted] weeks gestation of pregnancy Labs today Wishes to defer flu shot until 32 weeks TDaP nv  2. Encounter for supervision of normal first pregnancy in first trimester Desires IOL due to husband working [employer].  Discussed possible IOL 40 weeks due to her being G1P0.  Cervical exam may help with this decision making closer to 39 weeks.  Risks of IOL discussed.  Preterm labor symptoms and general obstetric precautions including but not limited to vaginal bleeding, contractions, leaking of fluid  and fetal movement were reviewed in detail with the patient. Please refer to After Visit Summary for other counseling recommendations.  Return in about 2 weeks (around 11/29/2017) for ROB.  Annamarie MajorPaul Jizel Cheeks, MD, Merlinda FrederickFACOG [facility], [facility] 11/15/2017  8:30 AM

## 2017-11-15 NOTE — Addendum Note (Signed)
Addended by: Cornelius MorasPATTERSON, Bonifacio Pruden D on: 11/15/2017 08:57 AM   Modules accepted: Orders

## 2017-11-15 NOTE — Patient Instructions (Signed)
Third Trimester of Pregnancy The third trimester is from week 28 through week 40 (months 7 through 9). The third trimester is a time when the unborn baby (fetus) is growing rapidly. At the end of the ninth month, the fetus is about 20 inches in length and weighs 6-10 pounds. Body changes during your third trimester Your body will continue to go through many changes during pregnancy. The changes vary from woman to woman. During the third trimester:  Your weight will continue to increase. You can expect to gain 25-35 pounds (11-16 kg) by the end of the pregnancy.  You may begin to get stretch marks on your hips, abdomen, and breasts.  You may urinate more often because the fetus is moving lower into your pelvis and pressing on your bladder.  You may develop or continue to have heartburn. This is caused by increased hormones that slow down muscles in the digestive tract.  You may develop or continue to have constipation because increased hormones slow digestion and cause the muscles that push waste through your intestines to relax.  You may develop hemorrhoids. These are swollen veins (varicose veins) in the rectum that can itch or be painful.  You may develop swollen, bulging veins (varicose veins) in your legs.  You may have increased body aches in the pelvis, back, or thighs. This is due to weight gain and increased hormones that are relaxing your joints.  You may have changes in your hair. These can include thickening of your hair, rapid growth, and changes in texture. Some women also have hair loss during or after pregnancy, or hair that feels dry or thin. Your hair will most likely return to normal after your baby is born.  Your breasts will continue to grow and they will continue to become tender. A yellow fluid (colostrum) may leak from your breasts. This is the first milk you are producing for your baby.  Your belly button may stick out.  You may notice more swelling in your hands,  face, or ankles.  You may have increased tingling or numbness in your hands, arms, and legs. The skin on your belly may also feel numb.  You may feel short of breath because of your expanding uterus.  You may have more problems sleeping. This can be caused by the size of your belly, increased need to urinate, and an increase in your body's metabolism.  You may notice the fetus "dropping," or moving lower in your abdomen (lightening).  You may have increased vaginal discharge.  You may notice your joints feel loose and you may have pain around your pelvic bone.  What to expect at prenatal visits You will have prenatal exams every 2 weeks until week 36. Then you will have weekly prenatal exams. During a routine prenatal visit:  You will be weighed to make sure you and the baby are growing normally.  Your blood pressure will be taken.  Your abdomen will be measured to track your baby's growth.  The fetal heartbeat will be listened to.  Any test results from the previous visit will be discussed.  You may have a cervical check near your due date to see if your cervix has softened or thinned (effaced).  You will be tested for Group B streptococcus. This happens between 35 and 37 weeks.  Your health care provider may ask you:  What your birth plan is.  How you are feeling.  If you are feeling the baby move.  If you have had   any abnormal symptoms, such as leaking fluid, bleeding, severe headaches, or abdominal cramping.  If you are using any tobacco products, including cigarettes, chewing tobacco, and electronic cigarettes.  If you have any questions.  Other tests or screenings that may be performed during your third trimester include:  Blood tests that check for low iron levels (anemia).  Fetal testing to check the health, activity level, and growth of the fetus. Testing is done if you have certain medical conditions or if there are problems during the  pregnancy.  Nonstress test (NST). This test checks the health of your baby to make sure there are no signs of problems, such as the baby not getting enough oxygen. During this test, a belt is placed around your belly. The baby is made to move, and its heart rate is monitored during movement.  What is false labor? False labor is a condition in which you feel small, irregular tightenings of the muscles in the womb (contractions) that usually go away with rest, changing position, or drinking water. These are called Braxton Hicks contractions. Contractions may last for hours, days, or even weeks before true labor sets in. If contractions come at regular intervals, become more frequent, increase in intensity, or become painful, you should see your health care provider. What are the signs of labor?  Abdominal cramps.  Regular contractions that start at 10 minutes apart and become stronger and more frequent with time.  Contractions that start on the top of the uterus and spread down to the lower abdomen and back.  Increased pelvic pressure and dull back pain.  A watery or bloody mucus discharge that comes from the vagina.  Leaking of amniotic fluid. This is also known as your "water breaking." It could be a slow trickle or a gush. Let your health care provider know if it has a color or strange odor. If you have any of these signs, call your health care provider right away, even if it is before your due date. Follow these instructions at home: Medicines  Follow your health care provider's instructions regarding medicine use. Specific medicines may be either safe or unsafe to take during pregnancy.  Take a prenatal vitamin that contains at least 600 micrograms (mcg) of folic acid.  If you develop constipation, try taking a stool softener if your health care provider approves. Eating and drinking  Eat a balanced diet that includes fresh fruits and vegetables, whole grains, good sources of protein  such as meat, eggs, or tofu, and low-fat dairy. Your health care provider will help you determine the amount of weight gain that is right for you.  Avoid raw meat and uncooked cheese. These carry germs that can cause birth defects in the baby.  If you have low calcium intake from food, talk to your health care provider about whether you should take a daily calcium supplement.  Eat four or five small meals rather than three large meals a day.  Limit foods that are high in fat and processed sugars, such as fried and sweet foods.  To prevent constipation: ? Drink enough fluid to keep your urine clear or pale yellow. ? Eat foods that are high in fiber, such as fresh fruits and vegetables, whole grains, and beans. Activity  Exercise only as directed by your health care provider. Most women can continue their usual exercise routine during pregnancy. Try to exercise for 30 minutes at least 5 days a week. Stop exercising if you experience uterine contractions.  Avoid heavy   lifting.  Do not exercise in extreme heat or humidity, or at high altitudes.  Wear low-heel, comfortable shoes.  Practice good posture.  You may continue to have sex unless your health care provider tells you otherwise. Relieving pain and discomfort  Take frequent breaks and rest with your legs elevated if you have leg cramps or low back pain.  Take warm sitz baths to soothe any pain or discomfort caused by hemorrhoids. Use hemorrhoid cream if your health care provider approves.  Wear a good support bra to prevent discomfort from breast tenderness.  If you develop varicose veins: ? Wear support pantyhose or compression stockings as told by your healthcare provider. ? Elevate your feet for 15 minutes, 3-4 times a day. Prenatal care  Write down your questions. Take them to your prenatal visits.  Keep all your prenatal visits as told by your health care provider. This is important. Safety  Wear your seat belt at  all times when driving.  Make a list of emergency phone numbers, including numbers for family, friends, the hospital, and police and fire departments. General instructions  Avoid cat litter boxes and soil used by cats. These carry germs that can cause birth defects in the baby. If you have a cat, ask someone to clean the litter box for you.  Do not travel far distances unless it is absolutely necessary and only with the approval of your health care provider.  Do not use hot tubs, steam rooms, or saunas.  Do not drink alcohol.  Do not use any products that contain nicotine or tobacco, such as cigarettes and e-cigarettes. If you need help quitting, ask your health care provider.  Do not use any medicinal herbs or unprescribed drugs. These chemicals affect the formation and growth of the baby.  Do not douche or use tampons or scented sanitary pads.  Do not cross your legs for long periods of time.  To prepare for the arrival of your baby: ? Take prenatal classes to understand, practice, and ask questions about labor and delivery. ? Make a trial run to the hospital. ? Visit the hospital and tour the maternity area. ? Arrange for maternity or paternity leave through employers. ? Arrange for family and friends to take care of pets while you are in the hospital. ? Purchase a rear-facing car seat and make sure you know how to install it in your car. ? Pack your hospital bag. ? Prepare the baby's nursery. Make sure to remove all pillows and stuffed animals from the baby's crib to prevent suffocation.  Visit your dentist if you have not gone during your pregnancy. Use a soft toothbrush to brush your teeth and be gentle when you floss. Contact a health care provider if:  You are unsure if you are in labor or if your water has broken.  You become dizzy.  You have mild pelvic cramps, pelvic pressure, or nagging pain in your abdominal area.  You have lower back pain.  You have persistent  nausea, vomiting, or diarrhea.  You have an unusual or bad smelling vaginal discharge.  You have pain when you urinate. Get help right away if:  Your water breaks before 37 weeks.  You have regular contractions less than 5 minutes apart before 37 weeks.  You have a fever.  You are leaking fluid from your vagina.  You have spotting or bleeding from your vagina.  You have severe abdominal pain or cramping.  You have rapid weight loss or weight gain.    You have shortness of breath with chest pain.  You notice sudden or extreme swelling of your face, hands, ankles, feet, or legs.  Your baby makes fewer than 10 movements in 2 hours.  You have severe headaches that do not go away when you take medicine.  You have vision changes. Summary  The third trimester is from week 28 through week 40, months 7 through 9. The third trimester is a time when the unborn baby (fetus) is growing rapidly.  During the third trimester, your discomfort may increase as you and your baby continue to gain weight. You may have abdominal, leg, and back pain, sleeping problems, and an increased need to urinate.  During the third trimester your breasts will keep growing and they will continue to become tender. A yellow fluid (colostrum) may leak from your breasts. This is the first milk you are producing for your baby.  False labor is a condition in which you feel small, irregular tightenings of the muscles in the womb (contractions) that eventually go away. These are called Braxton Hicks contractions. Contractions may last for hours, days, or even weeks before true labor sets in.  Signs of labor can include: abdominal cramps; regular contractions that start at 10 minutes apart and become stronger and more frequent with time; watery or bloody mucus discharge that comes from the vagina; increased pelvic pressure and dull back pain; and leaking of amniotic fluid. This information is not intended to replace advice  given to you by your health care provider. Make sure you discuss any questions you have with your health care provider. Document Released: 03/13/2001 Document Revised: 08/25/2015 Document Reviewed: 05/20/2012 Elsevier Interactive Patient Education  2017 [employer]  

## 2017-11-16 LAB — 28 WEEK RH+PANEL
BASOS ABS: 0 10*3/uL (ref 0.0–0.2)
Basos: 0 %
EOS (ABSOLUTE): 0 10*3/uL (ref 0.0–0.4)
EOS: 0 %
GESTATIONAL DIABETES SCREEN: 121 mg/dL (ref 65–139)
HIV Screen 4th Generation wRfx: NONREACTIVE
Hematocrit: 37.9 % (ref 34.0–46.6)
Hemoglobin: 12.2 g/dL (ref 11.1–15.9)
IMMATURE GRANS (ABS): 0.2 10*3/uL — AB (ref 0.0–0.1)
IMMATURE GRANULOCYTES: 1 %
Lymphocytes Absolute: 1.8 10*3/uL (ref 0.7–3.1)
Lymphs: 13 %
MCH: 26.5 pg — AB (ref 26.6–33.0)
MCHC: 32.2 g/dL (ref 31.5–35.7)
MCV: 82 fL (ref 79–97)
MONOS ABS: 0.6 10*3/uL (ref 0.1–0.9)
Monocytes: 5 %
NEUTROS PCT: 81 %
Neutrophils Absolute: 10.6 10*3/uL — ABNORMAL HIGH (ref 1.4–7.0)
PLATELETS: 250 10*3/uL (ref 150–450)
RBC: 4.6 x10E6/uL (ref 3.77–5.28)
RDW: 14.1 % (ref 12.3–15.4)
RPR: NONREACTIVE
WBC: 13.1 10*3/uL — ABNORMAL HIGH (ref 3.4–10.8)

## 2017-11-20 ENCOUNTER — Other Ambulatory Visit: Payer: Self-pay

## 2017-11-20 ENCOUNTER — Observation Stay
Admission: RE | Admit: 2017-11-20 | Discharge: 2017-11-20 | Disposition: A | Discharge status: Home / Self Care | Payer: 59 | Source: Ambulatory Visit | Attending: Certified Nurse Midwife | Admitting: Certified Nurse Midwife

## 2017-11-20 ENCOUNTER — Other Ambulatory Visit: Payer: Self-pay | Admitting: Certified Nurse Midwife

## 2017-11-20 ENCOUNTER — Encounter: Payer: Self-pay | Admitting: Certified Nurse Midwife

## 2017-11-20 DIAGNOSIS — Z3A29 29 weeks gestation of pregnancy: Secondary | ICD-10-CM | POA: Insufficient documentation

## 2017-11-20 DIAGNOSIS — R000 Tachycardia, unspecified: Secondary | ICD-10-CM | POA: Diagnosis not present

## 2017-11-20 DIAGNOSIS — R002 Palpitations: Secondary | ICD-10-CM | POA: Insufficient documentation

## 2017-11-20 DIAGNOSIS — Z3401 Encounter for supervision of normal first pregnancy, first trimester: Secondary | ICD-10-CM

## 2017-11-20 DIAGNOSIS — O26893 Other specified pregnancy related conditions, third trimester: Principal | ICD-10-CM | POA: Insufficient documentation

## 2017-11-20 HISTORY — DX: Polycystic ovarian syndrome: E28.2

## 2017-11-20 HISTORY — DX: Migraine with aura, not intractable, without status migrainosus: G43.109

## 2017-11-20 LAB — BASIC METABOLIC PANEL
ANION GAP: 8 (ref 5–15)
BUN: 8 mg/dL (ref 6–20)
CALCIUM: 9.7 mg/dL (ref 8.9–10.3)
CO2: 23 mmol/L (ref 22–32)
CREATININE: 0.59 mg/dL (ref 0.44–1.00)
Chloride: 107 mmol/L (ref 98–111)
Glucose, Bld: 77 mg/dL (ref 70–99)
Potassium: 3.7 mmol/L (ref 3.5–5.1)
Sodium: 138 mmol/L (ref 135–145)

## 2017-11-20 LAB — CBC
HCT: 37.1 % (ref 35.0–47.0)
Hemoglobin: 12.5 g/dL (ref 12.0–16.0)
MCH: 27.1 pg (ref 26.0–34.0)
MCHC: 33.8 g/dL (ref 32.0–36.0)
MCV: 80 fL (ref 80.0–100.0)
Platelets: 248 10*3/uL (ref 150–440)
RBC: 4.63 MIL/uL (ref 3.80–5.20)
RDW: 14 % (ref 11.5–14.5)
WBC: 14.2 10*3/uL — AB (ref 3.6–11.0)

## 2017-11-20 LAB — TSH: TSH: 1.621 u[IU]/mL (ref 0.350–4.500)

## 2017-11-20 NOTE — Final Progress Note (Signed)
Physician Final Progress Note  Patient ID: Jessica Johnson MRN: [id number] DOB/AGE: Aug 05, 1990 27 y.o.  Admit date: 11/20/2017 Admitting provider: Natale Milch, MD/ Gasper Lloyd. Jamaine Quintin Discharge date: 11/20/2017   Admission Diagnoses: Tachycardia, palpitations IUP at [redacted] week gestation  Discharge Diagnoses:  Same as above  Consults: Referral to cardiology as an outpatient  Significant Findings/ Diagnostic Studies: 27 year old G2 P34 WF with EDC=02/05/2018 presents at [redacted] weeks gestation with complaints of palpitations and tachycardia. Two days ago she started having episodes of palpitations and tachycardia. Both episodes occurred after getting to work and sitting down, usually 2 hours after awaking. She does eat breakfast. Her caffeine intake is minimal (4oz daily). The palpitations were accompanied by "distorted vision" and she felt "swimmy headed" and "hot".  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 She denies chest pain. Is not having palpitations at this moment.  Only medications are omeprazole and PNV Allergies: Penicillin  Prenatal care at [facility] remarkable for achieving pregnancy via IVF (PCOS and female factor) and migraine headaches with aura. Her care is also remarkable for:  Clinic [facility] Prenatal Labs  Dating  IVF Blood type: A/Positive/-- (04/15 1626)   Genetic Screen Declines Antibody:Negative (04/15 1626)  Anatomic [place] Complete 11/01/17 Rubella: 1.83 (04/15 1626) Varicella: Immune  GTT Early: 116      28 wk:   121   RPR: Non Reactive (04/15 1626)   Rhogam N/A HBsAg: Negative (04/15 1626)   TDaP vaccine                       HIV: Non Reactive (04/15 1626)   Flu Shot                                GBS:   Contraception  Pap: 2019 NIL  CBB     CS/VBAC    Baby Food    Support Person      Past Medical History:  Diagnosis Date  . Migraine with aura   . PCOS (polycystic ovarian syndrome)    Past  Surgical History:  Procedure Laterality Date  . CHOLECYSTECTOMY N/A 02/22/2014   Procedure: LAPAROSCOPIC CHOLECYSTECTOMY WITH INTRAOPERATIVE CHOLANGIOGRAM;  Surgeon: Chevis Pretty III, MD;  Location: [facility] OR;  Service: General;  Laterality: N/A;  . EYE SURGERY     lasik  2014   Family History  Problem Relation Age of Onset  . Skin cancer Mother   . Breast cancer Maternal Grandmother   . Skin cancer Maternal Grandmother   . Stomach cancer Paternal Grandfather    Social History   Socioeconomic History  . Marital status: Married    Spouse name: Not on file  . Number of children: Not on file  . Years of education: Not on file  . Highest education level: Not on file  Occupational History  . Not on file  Social Needs  . Financial resource strain: Not on file  . Food insecurity:    Worry: Not on file    Inability: Not on file  . Transportation needs:    Medical: Not on file    Non-medical: Not on file  Tobacco Use  . Smoking status: Never Smoker  . Smokeless tobacco: Never Used  Substance and Sexual Activity  . Alcohol use: Yes    Alcohol/week: 1.0 standard drinks  Types: 1 Cans of beer per week    Comment: occasionally  . Drug use: No  . Sexual activity: Yes  Lifestyle  . Physical activity:    Days per week: Not on file    Minutes per session: Not on file  . Stress: Not on file  Relationships  . Social connections:    Talks on phone: Not on file    Gets together: Not on file    Attends religious service: Not on file    Active member of club or organization: Not on file    Attends meetings of clubs or organizations: Not on file    Relationship status: Not on file  . Intimate partner violence:    Fear of current or ex partner: Not on file    Emotionally abused: Not on file    Physically abused: Not on file    Forced sexual activity: Not on file  Other Topics Concern  . Not on file  Social History Narrative  . Not on file   Exam: Vital Signs: BP 120/76   Pulse 98    LMP 05/03/2017 (Exact Date)   Pulse ranged from 82-104 Pulse O2 was 97-100%  General: WF, gravid, in NAD Physical Exam  Constitutional: She is oriented to person, place, and time.  Cardiovascular: Normal rate and regular rhythm. Exam reveals no gallop and no friction rub.  No murmur heard. Pulmonary/Chest: Effort normal and breath sounds normal.  Abdominal: Soft.  Neurological: She is alert and oriented to person, place, and time.  Skin: Skin is warm and dry.  Psychiatric: Mood, affect and judgment normal.  Extremities: no inflammation, tenderness or edema  FHR: 150-155 with accelerations to 160-165 (10x10s), moderate variability Toco: some irritability  EKG: NSR with a nonspecific T wave abnormality  Results for orders placed or performed during the hospital encounter of 11/20/17 (from the past 24 hour(s))  CBC     Status: Abnormal   Collection Time: 11/20/17 12:44 PM  Result Value Ref Range   WBC 14.2 (H) 3.6 - 11.0 K/uL   RBC 4.63 3.80 - 5.20 MIL/uL   Hemoglobin 12.5 12.0 - 16.0 g/dL   HCT 96.037.1 45.435.0 - 09.847.0 %   MCV 80.0 80.0 - 100.0 fL   MCH 27.1 26.0 - 34.0 pg   MCHC 33.8 32.0 - 36.0 g/dL   RDW 11.914.0 14.711.5 - 82.914.5 %   Platelets 248 150 - 440 K/uL  Basic metabolic panel     Status: None   Collection Time: 11/20/17 12:44 PM  Result Value Ref Range   Sodium 138 135 - 145 mmol/L   Potassium 3.7 3.5 - 5.1 mmol/L   Chloride 107 98 - 111 mmol/L   CO2 23 22 - 32 mmol/L   Glucose, Bld 77 70 - 99 mg/dL   BUN 8 6 - 20 mg/dL   Creatinine, Ser 5.620.59 0.44 - 1.00 mg/dL   Calcium 9.7 8.9 - 13.010.3 mg/dL   GFR calc non Af Amer >60 >60 mL/min   GFR calc Af Amer >60 >60 mL/min   Anion gap 8 5 - 15  TSH     Status: None   Collection Time: 11/20/17 12:44 PM  Result Value Ref Range   TSH 1.621 0.350 - 4.500 uIU/mL   A:  IUP at 29 weeks with self limiting episodes of palpitations/ tachycardia Possibly these symptoms may be related to compression on the inferior vena cava by the growing  fetus and uterus,  hypotension and vagal reaction?  P: DC home  Referral to cardiology as outpatient for [facility]    Procedures: none  Discharge Condition: stable  Disposition: Discharge disposition: 01-Home or Self Care       Diet: Regular diet  Discharge Activity: Activity as tolerated   Allergies as of 11/20/2017      Reactions   Penicillins Hives, Other (See Comments)   Has patient had a PCN reaction causing immediate rash, facial/tongue/throat swelling, SOB or lightheadedness with hypotension: Unknown Has patient had a PCN reaction causing severe rash involving mucus membranes or skin necrosis: Unknown Has patient had a PCN reaction that required hospitalization: Unknown Has patient had a PCN reaction occurring within the last 10 years: Unknown If all of the above answers are "NO", then may proceed with Cephalosporin use. All medication that are Cillins      Medication List    TAKE these medications   omeprazole 40 MG capsule Commonly known as:  PRILOSEC Take 1 capsule (40 mg total) by mouth daily.   prenatal multivitamin Tabs tablet Take 1 tablet by mouth daily at 12 noon.        Total time spent taking care of this patient: 20 minutes  Signed: Farrel Connersolleen Sun Kihn 11/20/2017, 3:41 PM

## 2017-11-20 NOTE — Discharge Instructions (Signed)
[facility] will contact you regarding a cardiology consult.

## 2017-11-20 NOTE — OB Triage Note (Signed)
Patient presents to labor and delivery for c/o dizziness, called  [facility] who directed her to come L&D for evaluation.  Patients states she has felt the baby move, no vaginal bleeding, leaking of fluid, contractions, painful or more frequent urination, and denies nausea, vomiting or diarrhea.  Karren Cobbleolleen Guiterrez, CNM present and performing assessment.

## 2017-11-22 ENCOUNTER — Other Ambulatory Visit: Payer: Self-pay | Admitting: Certified Nurse Midwife

## 2017-11-22 DIAGNOSIS — R002 Palpitations: Secondary | ICD-10-CM

## 2017-11-22 NOTE — Telephone Encounter (Signed)
Hi Jessica Johnson, any chance of getting this lady seen elsewhere a little sooner?

## 2017-11-27 DIAGNOSIS — R002 Palpitations: Secondary | ICD-10-CM | POA: Diagnosis not present

## 2017-11-27 DIAGNOSIS — R000 Tachycardia, unspecified: Secondary | ICD-10-CM | POA: Diagnosis not present

## 2017-11-27 DIAGNOSIS — R0789 Other chest pain: Secondary | ICD-10-CM | POA: Diagnosis not present

## 2017-11-29 ENCOUNTER — Encounter: Payer: Self-pay | Admitting: Maternal Newborn

## 2017-11-29 ENCOUNTER — Ambulatory Visit (INDEPENDENT_AMBULATORY_CARE_PROVIDER_SITE_OTHER): Payer: 59 | Admitting: Maternal Newborn

## 2017-11-29 VITALS — BP 110/60 | Wt 199.0 lb

## 2017-11-29 DIAGNOSIS — Z3A30 30 weeks gestation of pregnancy: Secondary | ICD-10-CM | POA: Diagnosis not present

## 2017-11-29 DIAGNOSIS — Z23 Encounter for immunization: Secondary | ICD-10-CM

## 2017-11-29 DIAGNOSIS — Z3403 Encounter for supervision of normal first pregnancy, third trimester: Secondary | ICD-10-CM

## 2017-11-29 DIAGNOSIS — Z3401 Encounter for supervision of normal first pregnancy, first trimester: Secondary | ICD-10-CM

## 2017-11-29 LAB — POCT URINALYSIS DIPSTICK OB: Glucose, UA: NEGATIVE

## 2017-11-29 NOTE — Progress Notes (Signed)
    Routine Prenatal Care Visit  Subjective  Jessica Robertsiffany Johnson is a 27 y.o. G2P0010 at 3169w2d being seen today for ongoing prenatal care.  She is currently monitored for the following issues for this low-risk pregnancy and has Encounter for supervision of normal first pregnancy in first trimester; Heartburn in pregnancy in first trimester; and Tachycardia on their problem list.  ----------------------------------------------------------------------------------- Patient reports recent visit with cardiology. Normal ECG. Has just completed Holter monitoring and will have follow up visit next week. Contractions: Not present. Vag. Bleeding: None.  Movement: Present. No leaking of fluid.  ----------------------------------------------------------------------------------- The following portions of the patient's history were reviewed and updated as appropriate: allergies, current medications, past family history, past medical history, past social history, past surgical history and problem list. Problem list updated.   Objective  Blood pressure 110/60, weight 199 lb (90.3 kg), last menstrual period 05/03/2017. Pregravid weight 168 lb (76.2 kg) Total Weight Gain 31 lb (14.1 kg) Body mass index is 32.12 kg/m. Urinalysis: Protein Trace, Glucose Negative  Fetal Status: Fetal Heart Rate (bpm): 144 Fundal Height: 29 cm Movement: Present     General:  Alert, oriented and cooperative. Patient is in no acute distress.  Skin: Skin is warm and dry. No rash noted.   Cardiovascular: Normal heart rate noted  Respiratory: Normal respiratory effort, no problems with respiration noted  Abdomen: Soft, gravid, appropriate for gestational age. Pain/Pressure: Absent     Pelvic:  Cervical exam deferred        Extremities: Normal range of motion.  Edema: None  Mental Status: Normal mood and affect. Normal behavior. Normal judgment and thought content.     Assessment   27 y.o. G2P0010 at 3369w2d, EDD 02/05/2018 by Est. Date  of Conception presenting for routine prenatal visit.  Plan   pregnancy2 Problems (from 05/03/17 to present)    Problem Noted Resolved   Encounter for supervision of normal first pregnancy in first trimester 07/24/2017 by Natale MilchSchuman, Christanna R, MD No   Overview Addendum 11/01/2017  2:31 PM by Oswaldo ConroySchmid, Mcclain Shall Y, CNM      Clinic [facility] Prenatal Labs  Dating  IVF Blood type: A/Positive/-- (04/15 1626)   Genetic Screen Declines Antibody:Negative (04/15 1626)  Anatomic US Complete 11/01/17 Rubella: 1.83 (04/15 1626) Varicella: Immune  GTT Early: collected 07/24/17       28 wk:      RPR: Non Reactive (04/15 1626)   Rhogam N/A HBsAg: Negative (04/15 1626)   TDaP vaccine                       HIV: Non Reactive (04/15 1626)   Flu Shot                                GBS:   Contraception  Pap: 2019 NIL  CBB     CS/VBAC    Baby Food    Support Person              TdaP today.   Please refer to After Visit Summary for other counseling recommendations.   Return in about 2 weeks (around 12/13/2017) for ROB.  Marcelyn BruinsJacelyn Terris Germano, CNM 11/29/2017  1:38 PM

## 2017-11-29 NOTE — Patient Instructions (Signed)
Third Trimester of Pregnancy The third trimester is from week 28 through week 40 (months 7 through 9). The third trimester is a time when the unborn baby (fetus) is growing rapidly. At the end of the ninth month, the fetus is about 20 inches in length and weighs 6-10 pounds. Body changes during your third trimester Your body will continue to go through many changes during pregnancy. The changes vary from woman to woman. During the third trimester:  Your weight will continue to increase. You can expect to gain 25-35 pounds (11-16 kg) by the end of the pregnancy.  You may begin to get stretch marks on your hips, abdomen, and breasts.  You may urinate more often because the fetus is moving lower into your pelvis and pressing on your bladder.  You may develop or continue to have heartburn. This is caused by increased hormones that slow down muscles in the digestive tract.  You may develop or continue to have constipation because increased hormones slow digestion and cause the muscles that push waste through your intestines to relax.  You may develop hemorrhoids. These are swollen veins (varicose veins) in the rectum that can itch or be painful.  You may develop swollen, bulging veins (varicose veins) in your legs.  You may have increased body aches in the pelvis, back, or thighs. This is due to weight gain and increased hormones that are relaxing your joints.  You may have changes in your hair. These can include thickening of your hair, rapid growth, and changes in texture. Some women also have hair loss during or after pregnancy, or hair that feels dry or thin. Your hair will most likely return to normal after your baby is born.  Your breasts will continue to grow and they will continue to become tender. A yellow fluid (colostrum) may leak from your breasts. This is the first milk you are producing for your baby.  Your belly button may stick out.  You may notice more swelling in your hands,  face, or ankles.  You may have increased tingling or numbness in your hands, arms, and legs. The skin on your belly may also feel numb.  You may feel short of breath because of your expanding uterus.  You may have more problems sleeping. This can be caused by the size of your belly, increased need to urinate, and an increase in your body's metabolism.  You may notice the fetus "dropping," or moving lower in your abdomen (lightening).  You may have increased vaginal discharge.  You may notice your joints feel loose and you may have pain around your pelvic bone.  What to expect at prenatal visits You will have prenatal exams every 2 weeks until week 36. Then you will have weekly prenatal exams. During a routine prenatal visit:  You will be weighed to make sure you and the baby are growing normally.  Your blood pressure will be taken.  Your abdomen will be measured to track your baby's growth.  The fetal heartbeat will be listened to.  Any test results from the previous visit will be discussed.  You may have a cervical check near your due date to see if your cervix has softened or thinned (effaced).  You will be tested for Group B streptococcus. This happens between 35 and 37 weeks.  Your health care provider may ask you:  What your birth plan is.  How you are feeling.  If you are feeling the baby move.  If you have had   any abnormal symptoms, such as leaking fluid, bleeding, severe headaches, or abdominal cramping.  If you are using any tobacco products, including cigarettes, chewing tobacco, and electronic cigarettes.  If you have any questions.  Other tests or screenings that may be performed during your third trimester include:  Blood tests that check for low iron levels (anemia).  Fetal testing to check the health, activity level, and growth of the fetus. Testing is done if you have certain medical conditions or if there are problems during the  pregnancy.  Nonstress test (NST). This test checks the health of your baby to make sure there are no signs of problems, such as the baby not getting enough oxygen. During this test, a belt is placed around your belly. The baby is made to move, and its heart rate is monitored during movement.  What is false labor? False labor is a condition in which you feel small, irregular tightenings of the muscles in the womb (contractions) that usually go away with rest, changing position, or drinking water. These are called Braxton Hicks contractions. Contractions may last for hours, days, or even weeks before true labor sets in. If contractions come at regular intervals, become more frequent, increase in intensity, or become painful, you should see your health care provider. What are the signs of labor?  Abdominal cramps.  Regular contractions that start at 10 minutes apart and become stronger and more frequent with time.  Contractions that start on the top of the uterus and spread down to the lower abdomen and back.  Increased pelvic pressure and dull back pain.  A watery or bloody mucus discharge that comes from the vagina.  Leaking of amniotic fluid. This is also known as your "water breaking." It could be a slow trickle or a gush. Let your health care provider know if it has a color or strange odor. If you have any of these signs, call your health care provider right away, even if it is before your due date. Follow these instructions at home: Medicines  Follow your health care provider's instructions regarding medicine use. Specific medicines may be either safe or unsafe to take during pregnancy.  Take a prenatal vitamin that contains at least 600 micrograms (mcg) of folic acid.  If you develop constipation, try taking a stool softener if your health care provider approves. Eating and drinking  Eat a balanced diet that includes fresh fruits and vegetables, whole grains, good sources of protein  such as meat, eggs, or tofu, and low-fat dairy. Your health care provider will help you determine the amount of weight gain that is right for you.  Avoid raw meat and uncooked cheese. These carry germs that can cause birth defects in the baby.  If you have low calcium intake from food, talk to your health care provider about whether you should take a daily calcium supplement.  Eat four or five small meals rather than three large meals a day.  Limit foods that are high in fat and processed sugars, such as fried and sweet foods.  To prevent constipation: ? Drink enough fluid to keep your urine clear or pale yellow. ? Eat foods that are high in fiber, such as fresh fruits and vegetables, whole grains, and beans. Activity  Exercise only as directed by your health care provider. Most women can continue their usual exercise routine during pregnancy. Try to exercise for 30 minutes at least 5 days a week. Stop exercising if you experience uterine contractions.  Avoid heavy   lifting.  Do not exercise in extreme heat or humidity, or at high altitudes.  Wear low-heel, comfortable shoes.  Practice good posture.  You may continue to have sex unless your health care provider tells you otherwise. Relieving pain and discomfort  Take frequent breaks and rest with your legs elevated if you have leg cramps or low back pain.  Take warm sitz baths to soothe any pain or discomfort caused by hemorrhoids. Use hemorrhoid cream if your health care provider approves.  Wear a good support bra to prevent discomfort from breast tenderness.  If you develop varicose veins: ? Wear support pantyhose or compression stockings as told by your healthcare provider. ? Elevate your feet for 15 minutes, 3-4 times a day. Prenatal care  Write down your questions. Take them to your prenatal visits.  Keep all your prenatal visits as told by your health care provider. This is important. Safety  Wear your seat belt at  all times when driving.  Make a list of emergency phone numbers, including numbers for family, friends, the hospital, and police and fire departments. General instructions  Avoid cat litter boxes and soil used by cats. These carry germs that can cause birth defects in the baby. If you have a cat, ask someone to clean the litter box for you.  Do not travel far distances unless it is absolutely necessary and only with the approval of your health care provider.  Do not use hot tubs, steam rooms, or saunas.  Do not drink alcohol.  Do not use any products that contain nicotine or tobacco, such as cigarettes and e-cigarettes. If you need help quitting, ask your health care provider.  Do not use any medicinal herbs or unprescribed drugs. These chemicals affect the formation and growth of the baby.  Do not douche or use tampons or scented sanitary pads.  Do not cross your legs for long periods of time.  To prepare for the arrival of your baby: ? Take prenatal classes to understand, practice, and ask questions about labor and delivery. ? Make a trial run to the hospital. ? Visit the hospital and tour the maternity area. ? Arrange for maternity or paternity leave through employers. ? Arrange for family and friends to take care of pets while you are in the hospital. ? Purchase a rear-facing car seat and make sure you know how to install it in your car. ? Pack your hospital bag. ? Prepare the baby's nursery. Make sure to remove all pillows and stuffed animals from the baby's crib to prevent suffocation.  Visit your dentist if you have not gone during your pregnancy. Use a soft toothbrush to brush your teeth and be gentle when you floss. Contact a health care provider if:  You are unsure if you are in labor or if your water has broken.  You become dizzy.  You have mild pelvic cramps, pelvic pressure, or nagging pain in your abdominal area.  You have lower back pain.  You have persistent  nausea, vomiting, or diarrhea.  You have an unusual or bad smelling vaginal discharge.  You have pain when you urinate. Get help right away if:  Your water breaks before 37 weeks.  You have regular contractions less than 5 minutes apart before 37 weeks.  You have a fever.  You are leaking fluid from your vagina.  You have spotting or bleeding from your vagina.  You have severe abdominal pain or cramping.  You have rapid weight loss or weight gain.    You have shortness of breath with chest pain.  You notice sudden or extreme swelling of your face, hands, ankles, feet, or legs.  Your baby makes fewer than 10 movements in 2 hours.  You have severe headaches that do not go away when you take medicine.  You have vision changes. Summary  The third trimester is from week 28 through week 40, months 7 through 9. The third trimester is a time when the unborn baby (fetus) is growing rapidly.  During the third trimester, your discomfort may increase as you and your baby continue to gain weight. You may have abdominal, leg, and back pain, sleeping problems, and an increased need to urinate.  During the third trimester your breasts will keep growing and they will continue to become tender. A yellow fluid (colostrum) may leak from your breasts. This is the first milk you are producing for your baby.  False labor is a condition in which you feel small, irregular tightenings of the muscles in the womb (contractions) that eventually go away. These are called Braxton Hicks contractions. Contractions may last for hours, days, or even weeks before true labor sets in.  Signs of labor can include: abdominal cramps; regular contractions that start at 10 minutes apart and become stronger and more frequent with time; watery or bloody mucus discharge that comes from the vagina; increased pelvic pressure and dull back pain; and leaking of amniotic fluid. This information is not intended to replace advice  given to you by your health care provider. Make sure you discuss any questions you have with your health care provider. Document Released: 03/13/2001 Document Revised: 08/25/2015 Document Reviewed: 05/20/2012 Elsevier Interactive Patient Education  2017 [employer]  

## 2017-11-29 NOTE — Progress Notes (Signed)
ROB-no concerns but pt saw on Wednesday a cardiologist. TDap and BT form today.

## 2017-11-29 NOTE — Addendum Note (Signed)
Addended by: Donnetta HailARROYO, Dann Galicia on: 11/29/2017 03:21 PM   Modules accepted: Orders

## 2017-12-04 DIAGNOSIS — R002 Palpitations: Secondary | ICD-10-CM | POA: Diagnosis not present

## 2017-12-04 DIAGNOSIS — R000 Tachycardia, unspecified: Secondary | ICD-10-CM | POA: Diagnosis not present

## 2017-12-13 ENCOUNTER — Encounter: Payer: Self-pay | Admitting: Obstetrics and Gynecology

## 2017-12-13 ENCOUNTER — Ambulatory Visit (INDEPENDENT_AMBULATORY_CARE_PROVIDER_SITE_OTHER): Payer: 59 | Admitting: Obstetrics and Gynecology

## 2017-12-13 VITALS — BP 114/68 | Wt 200.0 lb

## 2017-12-13 DIAGNOSIS — Z23 Encounter for immunization: Secondary | ICD-10-CM

## 2017-12-13 DIAGNOSIS — Z3A32 32 weeks gestation of pregnancy: Secondary | ICD-10-CM

## 2017-12-13 DIAGNOSIS — Z3403 Encounter for supervision of normal first pregnancy, third trimester: Secondary | ICD-10-CM

## 2017-12-13 DIAGNOSIS — Z3401 Encounter for supervision of normal first pregnancy, first trimester: Secondary | ICD-10-CM

## 2017-12-13 LAB — POCT URINALYSIS DIPSTICK OB
Glucose, UA: NEGATIVE
PROTEIN: NEGATIVE

## 2017-12-13 NOTE — Progress Notes (Signed)
  Routine Prenatal Care Visit  Subjective  Jessica Johnson is a 27 y.o. G2P0010 at 7124w2d being seen today for ongoing prenatal care.  She is currently monitored for the following issues for this low-risk pregnancy and has Encounter for supervision of normal first pregnancy in first trimester; Heartburn in pregnancy in first trimester; and Tachycardia on their problem list.  ----------------------------------------------------------------------------------- Patient reports no complaints.   Contractions: Not present. Vag. Bleeding: None.  Movement: Present. Denies leaking of fluid.  Patient has been evaluated for tachycardia.  No treatment or follow-up recommended. She still states she has intermittent tachycardia, but is less frequent.  ----------------------------------------------------------------------------------- The following portions of the patient's history were reviewed and updated as appropriate: allergies, current medications, past family history, past medical history, past social history, past surgical history and problem list. Problem list updated.  Objective  Blood pressure 114/68, weight 200 lb (90.7 kg), last menstrual period 05/03/2017. Pregravid weight 168 lb (76.2 kg) Total Weight Gain 32 lb (14.5 kg) Urinalysis: Urine Protein Negative  Urine Glucose Negative  Fetal Status: Fetal Heart Rate (bpm): 144   Movement: Present     General:  Alert, oriented and cooperative. Patient is in no acute distress.  Skin: Skin is warm and dry. No rash noted.   Cardiovascular: Normal heart rate noted  Respiratory: Normal respiratory effort, no problems with respiration noted  Abdomen: Soft, gravid, appropriate for gestational age. Pain/Pressure: Absent     Pelvic:  Cervical exam deferred        Extremities: Normal range of motion.  Edema: None  Mental Status: Normal mood and affect. Normal behavior. Normal judgment and thought content.   Assessment   27 y.o. G2P0010 at 924w2d by   02/05/2018, by Est. Date of Conception presenting for routine prenatal visit  Plan   pregnancy2 Problems (from 05/03/17 to present)    Problem Noted Resolved   Encounter for supervision of normal first pregnancy in first trimester 07/24/2017 by Natale MilchSchuman, Christanna R, MD No   Overview Addendum 11/01/2017  2:31 PM by Oswaldo ConroySchmid, Jacelyn Y, CNM      Clinic [facility] Prenatal Labs  Dating  IVF Blood type: A/Positive/-- (04/15 1626)   Genetic Screen Declines Antibody:Negative (04/15 1626)  Anatomic US Complete 11/01/17 Rubella: 1.83 (04/15 1626) Varicella: Immune  GTT Early: collected 07/24/17       28 wk:      RPR: Non Reactive (04/15 1626)   Rhogam N/A HBsAg: Negative (04/15 1626)   TDaP vaccine                       HIV: Non Reactive (04/15 1626)   Flu Shot                                GBS:   Contraception  Pap: 2019 NIL  CBB     CS/VBAC    Baby Food    Support Person                Preterm labor symptoms and general obstetric precautions including but not limited to vaginal bleeding, contractions, leaking of fluid and fetal movement were reviewed in detail with the patient. Please refer to After Visit Summary for other counseling recommendations.   Return in about 2 weeks (around 12/27/2017) for Routine Prenatal Appointment.  Thomasene MohairStephen Marico Buckle, MD, Merlinda FrederickFACOG [facility], [facility] 12/13/2017 12:06 PM

## 2017-12-20 DIAGNOSIS — G43119 Migraine with aura, intractable, without status migrainosus: Secondary | ICD-10-CM | POA: Diagnosis not present

## 2017-12-20 DIAGNOSIS — G43A0 Cyclical vomiting, not intractable: Secondary | ICD-10-CM | POA: Diagnosis not present

## 2017-12-23 DIAGNOSIS — Z3483 Encounter for supervision of other normal pregnancy, third trimester: Secondary | ICD-10-CM | POA: Diagnosis not present

## 2017-12-23 DIAGNOSIS — Z3482 Encounter for supervision of other normal pregnancy, second trimester: Secondary | ICD-10-CM | POA: Diagnosis not present

## 2017-12-27 ENCOUNTER — Ambulatory Visit (INDEPENDENT_AMBULATORY_CARE_PROVIDER_SITE_OTHER): Payer: 59 | Admitting: Obstetrics and Gynecology

## 2017-12-27 VITALS — BP 100/64 | Wt 204.0 lb

## 2017-12-27 DIAGNOSIS — Z3A34 34 weeks gestation of pregnancy: Secondary | ICD-10-CM

## 2017-12-27 DIAGNOSIS — Z3401 Encounter for supervision of normal first pregnancy, first trimester: Secondary | ICD-10-CM

## 2017-12-27 LAB — POCT URINALYSIS DIPSTICK OB
Glucose, UA: NEGATIVE
POC,PROTEIN,UA: NEGATIVE

## 2017-12-27 NOTE — Progress Notes (Signed)
ROB

## 2017-12-27 NOTE — Progress Notes (Signed)
    Routine Prenatal Care Visit  Subjective  Jessica Johnson is a 27 y.o. G2P0010 at [redacted]w[redacted]d being seen today for ongoing prenatal care.  She is currently monitored for the following issues for this low-risk pregnancy and has Encounter for supervision of normal first pregnancy in first trimester; Heartburn in pregnancy in first trimester; and Tachycardia on their problem list.  ----------------------------------------------------------------------------------- Patient reports no complaints.   Contractions: Not present. Vag. Bleeding: None.  Movement: Present. Denies leaking of fluid.  ----------------------------------------------------------------------------------- The following portions of the patient's history were reviewed and updated as appropriate: allergies, current medications, past family history, past medical history, past social history, past surgical history and problem list. Problem list updated.   Objective  Blood pressure 100/64, weight 204 lb (92.5 kg), last menstrual period 05/03/2017. Pregravid weight 168 lb (76.2 kg) Total Weight Gain 36 lb (16.3 kg) Urinalysis:      Fetal Status: Fetal Heart Rate (bpm): 150 Fundal Height: 33 cm Movement: Present     General:  Alert, oriented and cooperative. Patient is in no acute distress.  Skin: Skin is warm and dry. No rash noted.   Cardiovascular: Normal heart rate noted  Respiratory: Normal respiratory effort, no problems with respiration noted  Abdomen: Soft, gravid, appropriate for gestational age. Pain/Pressure: Absent     Pelvic:  Cervical exam deferred        Extremities: Normal range of motion.     ental Status: Normal mood and affect. Normal behavior. Normal judgment and thought content.     Assessment   27 y.o. G2P0010 at [redacted]w[redacted]d by  02/05/2018, by Est. Date of Conception presenting for routine prenatal visit  Plan   pregnancy2 Problems (from 05/03/17 to present)    Problem Noted Resolved   Encounter for supervision of  normal first pregnancy in first trimester 07/24/2017 by Natale Milch, MD No   Overview Addendum 11/01/2017  2:31 PM by Oswaldo Conroy, CNM      Clinic [facility] Prenatal Labs  Dating  IVF Blood type: A/Positive/-- (04/15 1626)   Genetic Screen Declines Antibody:Negative (04/15 1626)  Anatomic [place] Complete 11/01/17 Rubella: 1.83 (04/15 1626) Varicella: Immune  GTT Early: collected 07/24/17       28 wk: 121     RPR: Non Reactive (04/15 1626)   Rhogam N/A HBsAg: Negative (04/15 1626)   TDaP vaccine 11/29/17                  HIV: Non Reactive (04/15 1626)   Flu Shot 12/13/17                         GBS:   Contraception  Pap: 2019 NIL  CBB     CS/VBAC N/A   Baby Food    Support Person  Alinda Money              Immunization History  Administered Date(s) Administered  . Influenza,inj,Quad PF,6+ Mos 12/13/2017  . Tdap 11/29/2017    Gestational age appropriate obstetric precautions including but not limited to vaginal bleeding, contractions, leaking of fluid and fetal movement were reviewed in detail with the patient.    Return in about 2 weeks (around 01/10/2018) for ROB.  Vena Austria, MD, Evern Core [facility], [facility] 12/27/2017, 2:30 PM

## 2018-01-10 ENCOUNTER — Encounter: Payer: 59 | Admitting: Certified Nurse Midwife

## 2018-01-10 ENCOUNTER — Encounter: Payer: Self-pay | Admitting: Obstetrics and Gynecology

## 2018-01-10 ENCOUNTER — Ambulatory Visit (INDEPENDENT_AMBULATORY_CARE_PROVIDER_SITE_OTHER): Payer: 59 | Admitting: Obstetrics and Gynecology

## 2018-01-10 VITALS — BP 112/68 | Wt 206.0 lb

## 2018-01-10 DIAGNOSIS — Z3403 Encounter for supervision of normal first pregnancy, third trimester: Secondary | ICD-10-CM

## 2018-01-10 DIAGNOSIS — Z3401 Encounter for supervision of normal first pregnancy, first trimester: Secondary | ICD-10-CM

## 2018-01-10 DIAGNOSIS — Z3A36 36 weeks gestation of pregnancy: Secondary | ICD-10-CM

## 2018-01-10 LAB — POCT URINALYSIS DIPSTICK OB
Glucose, UA: NEGATIVE
POC,PROTEIN,UA: NEGATIVE

## 2018-01-10 LAB — OB RESULTS CONSOLE GBS: STREP GROUP B AG: NEGATIVE

## 2018-01-10 NOTE — Progress Notes (Signed)
ROB C/o pelvic pressure  GBS/Aptima

## 2018-01-10 NOTE — Progress Notes (Signed)
    Routine Prenatal Care Visit  Subjective  Jessica Johnson is a 27 y.o. G2P0010 at [redacted]w[redacted]d being seen today for ongoing prenatal care.  She is currently monitored for the following issues for this low-risk pregnancy and has Encounter for supervision of normal first pregnancy in first trimester; Heartburn in pregnancy in first trimester; and Tachycardia on their problem list.  ----------------------------------------------------------------------------------- Patient reports no complaints.   Contractions: Not present. Vag. Bleeding: None.  Movement: Present. Denies leaking of fluid.  ----------------------------------------------------------------------------------- The following portions of the patient's history were reviewed and updated as appropriate: allergies, current medications, past family history, past medical history, past social history, past surgical history and problem list. Problem list updated.   Objective  Blood pressure 112/68, weight 206 lb (93.4 kg), last menstrual period 05/03/2017. Pregravid weight 168 lb (76.2 kg) Total Weight Gain 38 lb (17.2 kg) Urinalysis:      Fetal Status: Fetal Heart Rate (bpm): 152 Fundal Height: 35 cm Movement: Present  Presentation: Vertex  General:  Alert, oriented and cooperative. Patient is in no acute distress.  Skin: Skin is warm and dry. No rash noted.   Cardiovascular: Normal heart rate noted  Respiratory: Normal respiratory effort, no problems with respiration noted  Abdomen: Soft, gravid, appropriate for gestational age. Pain/Pressure: Absent     Pelvic:  Cervical exam performed Dilation: 1 Effacement (%): 70 Station: -3  Extremities: Normal range of motion.     ental Status: Normal mood and affect. Normal behavior. Normal judgment and thought content.     Assessment   27 y.o. G2P0010 at [redacted]w[redacted]d by  02/05/2018, by Est. Date of Conception presenting for routine prenatal visit  Plan   pregnancy2 Problems (from 05/03/17 to present)      Problem Noted Resolved   Encounter for supervision of normal first pregnancy in first trimester 07/24/2017 by Natale Milch, MD No   Overview Addendum 01/10/2018 10:25 AM by Natale Milch, MD      Clinic [facility] Prenatal Labs  Dating  IVF Blood type: A/Positive/-- (04/15 1626)   Genetic Screen Declines Antibody:Negative (04/15 1626)  Anatomic [place] Complete 11/01/17 Rubella: 1.83 (04/15 1626) Varicella: Immune  GTT Early: collected 07/24/17       28 wk:121      RPR: Non Reactive (04/15 1626)   Rhogam N/A HBsAg: Negative (04/15 1626)   TDaP vaccine 11/29/17           HIV: Non Reactive (04/15 1626)   Flu Shot 12/13/17                             GBS:   Contraception Discussed- undecided Pap: 2019 NIL  CBB     CS/VBAC N/A   Baby Food  Breast   Support Person  Alinda Money               Gestational age appropriate obstetric precautions including but not limited to vaginal bleeding, contractions, leaking of fluid and fetal movement were reviewed in detail with the patient.    Discussed birth control options  Return in about 1 week (around 01/17/2018) for ROB.  Adelene Idler MD [facility], [facility] 01/10/18 10:26 AM

## 2018-01-13 LAB — GC/CHLAMYDIA PROBE AMP
CHLAMYDIA, DNA PROBE: NEGATIVE
Neisseria gonorrhoeae by PCR: NEGATIVE

## 2018-01-14 ENCOUNTER — Encounter

## 2018-01-14 ENCOUNTER — Ambulatory Visit: Payer: 59 | Admitting: Cardiovascular Disease

## 2018-01-14 LAB — CULTURE, BETA STREP (GROUP B ONLY): Strep Gp B Culture: NEGATIVE

## 2018-01-14 NOTE — Progress Notes (Signed)
Negative, Released to mychart 

## 2018-01-15 ENCOUNTER — Telehealth: Payer: Self-pay

## 2018-01-15 ENCOUNTER — Encounter: Payer: Self-pay | Admitting: Maternal Newborn

## 2018-01-15 ENCOUNTER — Ambulatory Visit (INDEPENDENT_AMBULATORY_CARE_PROVIDER_SITE_OTHER): Payer: 59 | Admitting: Maternal Newborn

## 2018-01-15 VITALS — BP 110/70 | Wt 209.0 lb

## 2018-01-15 DIAGNOSIS — Z3403 Encounter for supervision of normal first pregnancy, third trimester: Secondary | ICD-10-CM

## 2018-01-15 DIAGNOSIS — Z3A37 37 weeks gestation of pregnancy: Secondary | ICD-10-CM

## 2018-01-15 DIAGNOSIS — Z3401 Encounter for supervision of normal first pregnancy, first trimester: Secondary | ICD-10-CM

## 2018-01-15 LAB — POCT URINALYSIS DIPSTICK OB
Glucose, UA: NEGATIVE
POC,PROTEIN,UA: NEGATIVE

## 2018-01-15 NOTE — Telephone Encounter (Signed)
FMLA/DISABILITY form for [employer] filled out, signature obtained, and given to [place] for processing. 

## 2018-01-15 NOTE — Progress Notes (Signed)
    Routine Prenatal Care Visit  Subjective  Jessica Johnson is a 27 y.o. G2P0010 at [redacted]w[redacted]d being seen today for ongoing prenatal care.  She is currently monitored for the following issues for this low-risk pregnancy and has Encounter for supervision of normal first pregnancy in first trimester; Heartburn in pregnancy in first trimester; and Tachycardia on their problem list.  ----------------------------------------------------------------------------------- Patient reports pelvic pressure.   Contractions: Not present. Vag. Bleeding: None.  Movement: Present. No leaking of fluid.  ----------------------------------------------------------------------------------- The following portions of the patient's history were reviewed and updated as appropriate: allergies, current medications, past family history, past medical history, past social history, past surgical history and problem list. Problem list updated.  Objective  Blood pressure 110/70, weight 209 lb (94.8 kg), last menstrual period 05/03/2017. Pregravid weight 168 lb (76.2 kg) Total Weight Gain 41 lb (18.6 kg) Body mass index is 33.73 kg/m.   Urinalysis: Protein Negative, Glucose Negative  Fetal Status: Fetal Heart Rate (bpm): 144 Fundal Height: 36 cm Movement: Present  Presentation: Vertex  General:  Alert, oriented and cooperative. Patient is in no acute distress.  Skin: Skin is warm and dry. No rash noted.   Cardiovascular: Normal heart rate noted  Respiratory: Normal respiratory effort, no problems with respiration noted  Abdomen: Soft, gravid, appropriate for gestational age. Pain/Pressure: Present     Pelvic:  Cervical exam performed Dilation: 1.5 Effacement (%): 70 Station: -2  Extremities: Normal range of motion.  Edema: None  Mental Status: Normal mood and affect. Normal behavior. Normal judgment and thought content.     Assessment   27 y.o. G2P0010 at [redacted]w[redacted]d, EDD 02/05/2018 by Est. Date of Conception presenting for a  routine prenatal visit.  Plan   pregnancy2 Problems (from 05/03/17 to present)    Problem Noted Resolved   Encounter for supervision of normal first pregnancy in first trimester 07/24/2017 by Natale Milch, MD No   Overview Addendum 01/10/2018 10:25 AM by Natale Milch, MD      Clinic [facility] Prenatal Labs  Dating  IVF Blood type: A/Positive/-- (04/15 1626)   Genetic Screen Declines Antibody:Negative (04/15 1626)  Anatomic [place] Complete 11/01/17 Rubella: 1.83 (04/15 1626) Varicella: Immune  GTT Early: collected 07/24/17       28 wk:121      RPR: Non Reactive (04/15 1626)   Rhogam N/A HBsAg: Negative (04/15 1626)   TDaP vaccine 11/29/17           HIV: Non Reactive (04/15 1626)   Flu Shot 12/13/17                             GBS:   Contraception Discussed- undecided Pap: 2019 NIL  CBB     CS/VBAC N/A   Baby Food  Breast   Support Person  Alinda Money              Term labor symptoms and general obstetric precautions including but not limited to vaginal bleeding, contractions, leaking of fluid and fetal movement were reviewed.  Please refer to After Visit Summary for other counseling recommendations.   Return in about 1 week (around 01/22/2018) for ROB.  Marcelyn Bruins, CNM 01/15/2018  8:43 AM

## 2018-01-15 NOTE — Patient Instructions (Signed)
Vaginal Delivery Vaginal delivery means that you will give birth by pushing your baby out of your birth canal (vagina). A team of health care providers will help you before, during, and after vaginal delivery. Birth experiences are unique for every woman and every pregnancy, and birth experiences vary depending on where you choose to give birth. What should I do to prepare for my baby's birth? Before your baby is born, it is important to talk with your health care provider about:  Your labor and delivery preferences. These may include: ? Medicines that you may be given. ? How you will manage your pain. This might include non-medical pain relief techniques or injectable pain relief such as epidural analgesia. ? How you and your baby will be monitored during labor and delivery. ? Who may be in the labor and delivery room with you. ? Your feelings about surgical delivery of your baby (cesarean delivery, or C-section) if this becomes necessary. ? Your feelings about receiving donated blood through an IV tube (blood transfusion) if this becomes necessary.  Whether you are able: ? To take pictures or videos of the birth. ? To eat during labor and delivery. ? To move around, walk, or change positions during labor and delivery.  What to expect after your baby is born, such as: ? Whether delayed umbilical cord clamping and cutting is offered. ? Who will care for your baby right after birth. ? Medicines or tests that may be recommended for your baby. ? Whether breastfeeding is supported in your hospital or birth center. ? How long you will be in the hospital or birth center.  How any medical conditions you have may affect your baby or your labor and delivery experience.  To prepare for your baby's birth, you should also:  Attend all of your health care visits before delivery (prenatal visits) as recommended by your health care provider. This is important.  Prepare your home for your baby's  arrival. Make sure that you have: ? Diapers. ? Baby clothing. ? Feeding equipment. ? Safe sleeping arrangements for you and your baby.  Install a car seat in your vehicle. Have your car seat checked by a certified car seat installer to make sure that it is installed safely.  Think about who will help you with your new baby at home for at least the first several weeks after delivery.  What can I expect when I arrive at the birth center or hospital? Once you are in labor and have been admitted into the hospital or birth center, your health care provider may:  Review your pregnancy history and any concerns you have.  Insert an IV tube into one of your veins. This is used to give you fluids and medicines.  Check your blood pressure, pulse, temperature, and heart rate (vital signs).  Check whether your bag of water (amniotic sac) has broken (ruptured).  Talk with you about your birth plan and discuss pain control options.  Monitoring Your health care provider may monitor your contractions (uterine monitoring) and your baby's heart rate (fetal monitoring). You may need to be monitored:  Often, but not continuously (intermittently).  All the time or for long periods at a time (continuously). Continuous monitoring may be needed if: ? You are taking certain medicines, such as medicine to relieve pain or make your contractions stronger. ? You have pregnancy or labor complications.  Monitoring may be done by:  Placing a special stethoscope or a handheld monitoring device on your abdomen to   check your baby's heartbeat, and feeling your abdomen for contractions. This method of monitoring does not continuously record your baby's heartbeat or your contractions.  Placing monitors on your abdomen (external monitors) to record your baby's heartbeat and the frequency and length of contractions. You may not have to wear external monitors all the time.  Placing monitors inside of your uterus  (internal monitors) to record your baby's heartbeat and the frequency, length, and strength of your contractions. ? Your health care provider may use internal monitors if he or she needs more information about the strength of your contractions or your baby's heart rate. ? Internal monitors are put in place by passing a thin, flexible wire through your vagina and into your uterus. Depending on the type of monitor, it may remain in your uterus or on your baby's head until birth. ? Your health care provider will discuss the benefits and risks of internal monitoring with you and will ask for your permission before inserting the monitors.  Telemetry. This is a type of continuous monitoring that can be done with external or internal monitors. Instead of having to stay in bed, you are able to move around during telemetry. Ask your health care provider if telemetry is an option for you.  Physical exam Your health care provider may perform a physical exam. This may include:  Checking whether your baby is positioned: ? With the head toward your vagina (head-down). This is most common. ? With the head toward the top of your uterus (head-up or breech). If your baby is in a breech position, your health care provider may try to turn your baby to a head-down position so you can deliver vaginally. If it does not seem that your baby can be born vaginally, your provider may recommend surgery to deliver your baby. In rare cases, you may be able to deliver vaginally if your baby is head-up (breech delivery). ? Lying sideways (transverse). Babies that are lying sideways cannot be delivered vaginally.  Checking your cervix to determine: ? Whether it is thinning out (effacing). ? Whether it is opening up (dilating). ? How low your baby has moved into your birth canal.  What are the three stages of labor and delivery?  Normal labor and delivery is divided into the following three stages: Stage 1  Stage 1 is the  longest stage of labor, and it can last for hours or days. Stage 1 includes: ? Early labor. This is when contractions may be irregular, or regular and mild. Generally, early labor contractions are more than 10 minutes apart. ? Active labor. This is when contractions get longer, more regular, more frequent, and more intense. ? The transition phase. This is when contractions happen very close together, are very intense, and may last longer than during any other part of labor.  Contractions generally feel mild, infrequent, and irregular at first. They get stronger, more frequent (about every 2-3 minutes), and more regular as you progress from early labor through active labor and transition.  Many women progress through stage 1 naturally, but you may need help to continue making progress. If this happens, your health care provider may talk with you about: ? Rupturing your amniotic sac if it has not ruptured yet. ? Giving you medicine to help make your contractions stronger and more frequent.  Stage 1 ends when your cervix is completely dilated to 4 inches (10 cm) and completely effaced. This happens at the end of the transition phase. Stage 2  Once   your cervix is completely effaced and dilated to 4 inches (10 cm), you may start to feel an urge to push. It is common for the body to naturally take a rest before feeling the urge to push, especially if you received an epidural or certain other pain medicines. This rest period may last for up to 1-2 hours, depending on your unique labor experience.  During stage 2, contractions are generally less painful, because pushing helps relieve contraction pain. Instead of contraction pain, you may feel stretching and burning pain, especially when the widest part of your baby's head passes through the vaginal opening (crowning).  Your health care provider will closely monitor your pushing progress and your baby's progress through the vagina during stage 2.  Your  health care provider may massage the area of skin between your vaginal opening and anus (perineum) or apply warm compresses to your perineum. This helps it stretch as the baby's head starts to crown, which can help prevent perineal tearing. ? In some cases, an incision may be made in your perineum (episiotomy) to allow the baby to pass through the vaginal opening. An episiotomy helps to make the opening of the vagina larger to allow more room for the baby to fit through.  It is very important to breathe and focus so your health care provider can control the delivery of your baby's head. Your health care provider may have you decrease the intensity of your pushing, to help prevent perineal tearing.  After delivery of your baby's head, the shoulders and the rest of the body generally deliver very quickly and without difficulty.  Once your baby is delivered, the umbilical cord may be cut right away, or this may be delayed for 1-2 minutes, depending on your baby's health. This may vary among health care providers, hospitals, and birth centers.  If you and your baby are healthy enough, your baby may be placed on your chest or abdomen to help maintain the baby's temperature and to help you bond with each other. Some mothers and babies start breastfeeding at this time. Your health care team will dry your baby and help keep your baby warm during this time.  Your baby may need immediate care if he or she: ? Showed signs of distress during labor. ? Has a medical condition. ? Was born too early (prematurely). ? Had a bowel movement before birth (meconium). ? Shows signs of difficulty transitioning from being inside the uterus to being outside of the uterus. If you are planning to breastfeed, your health care team will help you begin a feeding. Stage 3  The third stage of labor starts immediately after the birth of your baby and ends after you deliver the placenta. The placenta is an organ that develops  during pregnancy to provide oxygen and nutrients to your baby in the womb.  Delivering the placenta may require some pushing, and you may have mild contractions. Breastfeeding can stimulate contractions to help you deliver the placenta.  After the placenta is delivered, your uterus should tighten (contract) and become firm. This helps to stop bleeding in your uterus. To help your uterus contract and to control bleeding, your health care provider may: ? Give you medicine by injection, through an IV tube, by mouth, or through your rectum (rectally). ? Massage your abdomen or perform a vaginal exam to remove any blood clots that are left in your uterus. ? Empty your bladder by placing a thin, flexible tube (catheter) into your bladder. ? Encourage   you to breastfeed your baby. After labor is over, you and your baby will be monitored closely to ensure that you are both healthy until you are ready to go home. Your health care team will teach you how to care for yourself and your baby. This information is not intended to replace advice given to you by your health care provider. Make sure you discuss any questions you have with your health care provider. Document Released: 12/27/2007 Document Revised: 10/07/2015 Document Reviewed: 04/03/2015 Elsevier Interactive Patient Education  2018 [employer] Inc[employer]  

## 2018-01-22 ENCOUNTER — Ambulatory Visit (INDEPENDENT_AMBULATORY_CARE_PROVIDER_SITE_OTHER): Payer: 59 | Admitting: Advanced Practice Midwife

## 2018-01-22 ENCOUNTER — Encounter: Payer: Self-pay | Admitting: Advanced Practice Midwife

## 2018-01-22 VITALS — BP 106/70 | Wt 210.0 lb

## 2018-01-22 DIAGNOSIS — Z3A38 38 weeks gestation of pregnancy: Secondary | ICD-10-CM

## 2018-01-22 DIAGNOSIS — Z3483 Encounter for supervision of other normal pregnancy, third trimester: Secondary | ICD-10-CM

## 2018-01-22 LAB — POCT URINALYSIS DIPSTICK OB
GLUCOSE, UA: NEGATIVE
PROTEIN: NEGATIVE

## 2018-01-22 NOTE — Progress Notes (Signed)
  Routine Prenatal Care Visit  Subjective  Jessica Johnson is a 27 y.o. G2P0010 at [redacted]w[redacted]d being seen today for ongoing prenatal care.  She is currently monitored for the following issues for this low-risk pregnancy and has Encounter for supervision of normal first pregnancy in first trimester; Heartburn in pregnancy in first trimester; and Tachycardia on their problem list.  ----------------------------------------------------------------------------------- Patient reports no complaints.   Contractions: Irregular. Vag. Bleeding: None.  Movement: Present. Denies leaking of fluid.  ----------------------------------------------------------------------------------- The following portions of the patient's history were reviewed and updated as appropriate: allergies, current medications, past family history, past medical history, past social history, past surgical history and problem list. Problem list updated.   Objective  Blood pressure 106/70, weight 210 lb (95.3 kg), last menstrual period 05/03/2017. Pregravid weight 168 lb (76.2 kg) Total Weight Gain 42 lb (19.1 kg) Urinalysis: Urine Protein Negative  Urine Glucose Negative  Fetal Status: Fetal Heart Rate (bpm): 143 Fundal Height: 37 cm Movement: Present  Presentation: Vertex  General:  Alert, oriented and cooperative. Patient is in no acute distress.  Skin: Skin is warm and dry. No rash noted.   Cardiovascular: Normal heart rate noted  Respiratory: Normal respiratory effort, no problems with respiration noted  Abdomen: Soft, gravid, appropriate for gestational age. Pain/Pressure: Present     Pelvic:  Cervical exam performed Dilation: 1.5 Effacement (%): 70 Station: -2, cervical sweep  Extremities: Normal range of motion.  Edema: None  Mental Status: Normal mood and affect. Normal behavior. Normal judgment and thought content.   Assessment   27 y.o. G2P0010 at [redacted]w[redacted]d by  02/05/2018, by Est. Date of Conception presenting for routine prenatal  visit  Plan   pregnancy2 Problems (from 05/03/17 to present)    Problem Noted Resolved   Encounter for supervision of normal first pregnancy in first trimester 07/24/2017 by Natale Milch, MD No   Overview Addendum 01/10/2018 10:25 AM by Natale Milch, MD      [facility]inic [facility] Prenatal Labs  Dating  IVF Blood type: A/Positive/-- (04/15 1626)   Genetic Screen Declines Antibody:Negative (04/15 1626)  Anatomic [place] Complete 11/01/17 Rubella: 1.83 (04/15 1626) Varicella: Immune  GTT Early: collected 07/24/17       28 wk:121      RPR: Non Reactive (04/15 1626)   Rhogam N/A HBsAg: Negative (04/15 1626)   TDaP vaccine 11/29/17           HIV: Non Reactive (04/15 1626)   Flu Shot 12/13/17                             GBS:   Contraception Discussed- undecided Pap: 2019 NIL  CBB     CS/VBAC N/A   Baby Food  Breast   Support Person  Jessica Johnson               Term labor symptoms and general obstetric precautions including but not limited to vaginal bleeding, contractions, leaking of fluid and fetal movement were reviewed in detail with the patient. Please refer to After Visit Summary for other counseling recommendations.   Return in about 1 week (around 01/29/2018) for rob.  Britiany Silbernagel, CNM 01/22/2018 11:43 AM

## 2018-01-22 NOTE — Progress Notes (Signed)
ROB

## 2018-01-28 ENCOUNTER — Telehealth: Payer: Self-pay

## 2018-01-28 NOTE — Telephone Encounter (Signed)
FMLA/DISABILITY form for [employer] for spouse, Jimmye Wisnieski, filled out, signature obtained and given to [place] for processing.

## 2018-01-29 ENCOUNTER — Ambulatory Visit (INDEPENDENT_AMBULATORY_CARE_PROVIDER_SITE_OTHER): Payer: 59 | Admitting: Obstetrics and Gynecology

## 2018-01-29 ENCOUNTER — Encounter: Payer: Self-pay | Admitting: Obstetrics and Gynecology

## 2018-01-29 VITALS — BP 122/80 | Wt 211.0 lb

## 2018-01-29 DIAGNOSIS — Z3A39 39 weeks gestation of pregnancy: Secondary | ICD-10-CM

## 2018-01-29 DIAGNOSIS — Z3401 Encounter for supervision of normal first pregnancy, first trimester: Secondary | ICD-10-CM

## 2018-01-29 DIAGNOSIS — Z3403 Encounter for supervision of normal first pregnancy, third trimester: Secondary | ICD-10-CM

## 2018-01-29 NOTE — Progress Notes (Signed)
     Routine Prenatal Care Visit  Subjective  Jessica Johnson is a 27 y.o. G2P0010 at [redacted]w[redacted]d being seen today for ongoing prenatal care.  She is currently monitored for the following issues for this low-risk pregnancy and has Encounter for supervision of normal first pregnancy in first trimester; Heartburn in pregnancy in first trimester; and Tachycardia on their problem list.  ----------------------------------------------------------------------------------- Patient reports no complaints.   Contractions: Not present. Vag. Bleeding: None.  Movement: Present. Denies leaking of fluid.  ----------------------------------------------------------------------------------- The following portions of the patient's history were reviewed and updated as appropriate: allergies, current medications, past family history, past medical history, past social history, past surgical history and problem list. Problem list updated.   Objective  Blood pressure 122/80, weight 211 lb (95.7 kg), last menstrual period 05/03/2017. Pregravid weight 168 lb (76.2 kg) Total Weight Gain 43 lb (19.5 kg) Urinalysis:      Fetal Status: Fetal Heart Rate (bpm): 134   Movement: Present  Presentation: Vertex  General:  Alert, oriented and cooperative. Patient is in no acute distress.  Skin: Skin is warm and dry. No rash noted.   Cardiovascular: Normal heart rate noted  Respiratory: Normal respiratory effort, no problems with respiration noted  Abdomen: Soft, gravid, appropriate for gestational age. Pain/Pressure: Absent     Pelvic:  Cervical exam performed Dilation: 2 Effacement (%): 60 Station: -3  Extremities: Normal range of motion.  Edema: None  Mental Status: Normal mood and affect. Normal behavior. Normal judgment and thought content.     Assessment   27 y.o. G2P0010 at [redacted]w[redacted]d by  02/05/2018, by Est. Date of Conception presenting for routine prenatal visit  Plan   pregnancy2 Problems (from 05/03/17 to present)    Problem Noted Resolved   Encounter for supervision of normal first pregnancy in first trimester 07/24/2017 by Natale Milch, MD No   Overview Addendum 01/29/2018  2:59 PM by Natale Milch, MD      [facility]inic [facility] Prenatal Labs  Dating  IVF Blood type: A/Positive/-- (04/15 1626)   Genetic Screen Declines Antibody:Negative (04/15 1626)  Anatomic [place] Complete 11/01/17 Rubella: 1.83 (04/15 1626) Varicella: Immune  GTT Early: collected 07/24/17       28 wk:121      RPR: Non Reactive (04/15 1626)   Rhogam N/A HBsAg: Negative (04/15 1626)   TDaP vaccine 11/29/17           HIV: Non Reactive (04/15 1626)   Flu Shot 12/13/17                             GBS:  negative  Contraception Discussed- undecided Pap: 2019 NIL  CBB     CS/VBAC N/A   Baby Food  Breast   Support Person  Alinda Money               Gestational age appropriate obstetric precautions including but not limited to vaginal bleeding, contractions, leaking of fluid and fetal movement were reviewed in detail with the patient.    IOL scheduled for 02/04/18 at 8am  Return in about 1 week (around 02/05/2018) for ROB.  Natale Milch MD [facility], [facility] 01/29/2018, 3:38 PM

## 2018-01-29 NOTE — Progress Notes (Signed)
ROB Some contractions, denies lof Desires cervical check

## 2018-02-04 ENCOUNTER — Inpatient Hospital Stay: Payer: 59 | Admitting: Anesthesiology

## 2018-02-04 ENCOUNTER — Other Ambulatory Visit: Payer: Self-pay

## 2018-02-04 ENCOUNTER — Inpatient Hospital Stay
Admission: EM | Admit: 2018-02-04 | Discharge: 2018-02-06 | DRG: 807 | Disposition: A | Discharge status: Home / Self Care | Payer: 59 | Attending: Advanced Practice Midwife | Admitting: Advanced Practice Midwife

## 2018-02-04 DIAGNOSIS — Z3A39 39 weeks gestation of pregnancy: Secondary | ICD-10-CM

## 2018-02-04 DIAGNOSIS — Z880 Allergy status to penicillin: Secondary | ICD-10-CM

## 2018-02-04 DIAGNOSIS — Z3A40 40 weeks gestation of pregnancy: Secondary | ICD-10-CM | POA: Diagnosis not present

## 2018-02-04 DIAGNOSIS — Z3401 Encounter for supervision of normal first pregnancy, first trimester: Secondary | ICD-10-CM

## 2018-02-04 DIAGNOSIS — Z3490 Encounter for supervision of normal pregnancy, unspecified, unspecified trimester: Secondary | ICD-10-CM | POA: Diagnosis present

## 2018-02-04 DIAGNOSIS — Z3483 Encounter for supervision of other normal pregnancy, third trimester: Secondary | ICD-10-CM | POA: Diagnosis present

## 2018-02-04 LAB — TYPE AND SCREEN
ABO/RH(D): A POS
Antibody Screen: NEGATIVE

## 2018-02-04 LAB — CBC
HEMATOCRIT: 37.8 % (ref 36.0–46.0)
HEMOGLOBIN: 12.6 g/dL (ref 12.0–15.0)
MCH: 26.6 pg (ref 26.0–34.0)
MCHC: 33.3 g/dL (ref 30.0–36.0)
MCV: 79.9 fL — AB (ref 80.0–100.0)
NRBC: 0 % (ref 0.0–0.2)
Platelets: 174 10*3/uL (ref 150–400)
RBC: 4.73 MIL/uL (ref 3.87–5.11)
RDW: 14.6 % (ref 11.5–15.5)
WBC: 10.7 10*3/uL — ABNORMAL HIGH (ref 4.0–10.5)

## 2018-02-04 MED ORDER — MISOPROSTOL 25 MCG QUARTER TABLET
25.0000 ug | ORAL_TABLET | ORAL | Status: DC | PRN
  Administered 2018-02-04: 25 ug via VAGINAL
  Filled 2018-02-04 (×2): qty 1

## 2018-02-04 MED ORDER — FENTANYL 2.5 MCG/ML W/ROPIVACAINE 0.15% IN NS 100 ML EPIDURAL (ARMC)
EPIDURAL | Status: AC
  Filled 2018-02-04: qty 100

## 2018-02-04 MED ORDER — SOD CITRATE-CITRIC ACID 500-334 MG/5ML PO SOLN: 30.0000 mL | ORAL | Status: DC | PRN

## 2018-02-04 MED ORDER — DIPHENHYDRAMINE HCL 50 MG/ML IJ SOLN: 12.5000 mg | INTRAMUSCULAR | Status: DC | PRN

## 2018-02-04 MED ORDER — BUPIVACAINE HCL (PF) 0.25 % IJ SOLN
INTRAMUSCULAR | Status: DC | PRN
  Administered 2018-02-04: 5 mL via EPIDURAL

## 2018-02-04 MED ORDER — PHENYLEPHRINE 40 MCG/ML (10ML) SYRINGE FOR IV PUSH (FOR BLOOD PRESSURE SUPPORT): 80.0000 ug | PREFILLED_SYRINGE | INTRAVENOUS | Status: DC | PRN

## 2018-02-04 MED ORDER — LIDOCAINE HCL (PF) 1 % IJ SOLN
INTRAMUSCULAR | Status: AC
  Filled 2018-02-04: qty 30

## 2018-02-04 MED ORDER — LIDOCAINE HCL (PF) 1 % IJ SOLN: 30.0000 mL | INTRAMUSCULAR | Status: DC | PRN

## 2018-02-04 MED ORDER — OXYTOCIN 40 UNITS IN LACTATED RINGERS INFUSION - SIMPLE MED: 2.5000 [IU]/h | INTRAVENOUS | Status: DC

## 2018-02-04 MED ORDER — OXYTOCIN 40 UNITS IN LACTATED RINGERS INFUSION - SIMPLE MED
INTRAVENOUS | Status: AC
  Filled 2018-02-04: qty 1000

## 2018-02-04 MED ORDER — LACTATED RINGERS IV SOLN
INTRAVENOUS | Status: DC
  Administered 2018-02-04 – 2018-02-05 (×3): via INTRAVENOUS

## 2018-02-04 MED ORDER — OXYTOCIN 10 UNIT/ML IJ SOLN
INTRAMUSCULAR | Status: AC
  Filled 2018-02-04: qty 2

## 2018-02-04 MED ORDER — LACTATED RINGERS IV SOLN: 500.0000 mL | Freq: Once | INTRAVENOUS | Status: DC

## 2018-02-04 MED ORDER — ONDANSETRON HCL 4 MG/2ML IJ SOLN
4.0000 mg | Freq: Four times a day (QID) | INTRAMUSCULAR | Status: DC | PRN
  Administered 2018-02-05: 4 mg via INTRAVENOUS
  Filled 2018-02-04: qty 2

## 2018-02-04 MED ORDER — EPHEDRINE 5 MG/ML INJ: 10.0000 mg | INTRAVENOUS | Status: DC | PRN

## 2018-02-04 MED ORDER — OXYTOCIN BOLUS FROM INFUSION
500.0000 mL | Freq: Once | INTRAVENOUS | Status: DC
  Administered 2018-02-05: 500 mL via INTRAVENOUS

## 2018-02-04 MED ORDER — OXYTOCIN 40 UNITS IN LACTATED RINGERS INFUSION - SIMPLE MED
1.0000 m[IU]/min | INTRAVENOUS | Status: DC
  Administered 2018-02-04: 2 m[IU]/min via INTRAVENOUS
  Filled 2018-02-04: qty 1000

## 2018-02-04 MED ORDER — LACTATED RINGERS IV SOLN: 500.0000 mL | INTRAVENOUS | Status: DC | PRN

## 2018-02-04 MED ORDER — AMMONIA AROMATIC IN INHA
RESPIRATORY_TRACT | Status: AC
  Filled 2018-02-04: qty 10

## 2018-02-04 MED ORDER — MISOPROSTOL 200 MCG PO TABS
ORAL_TABLET | ORAL | Status: AC
  Filled 2018-02-04: qty 4

## 2018-02-04 MED ORDER — FENTANYL 2.5 MCG/ML W/ROPIVACAINE 0.15% IN NS 100 ML EPIDURAL (ARMC)
12.0000 mL/h | EPIDURAL | Status: DC
  Administered 2018-02-04: 12 mL/h via EPIDURAL
  Filled 2018-02-04: qty 100

## 2018-02-04 MED ORDER — BUTORPHANOL TARTRATE 2 MG/ML IJ SOLN
1.0000 mg | INTRAMUSCULAR | Status: DC | PRN
  Administered 2018-02-04: 1 mg via INTRAVENOUS
  Filled 2018-02-04: qty 1

## 2018-02-04 MED ORDER — TERBUTALINE SULFATE 1 MG/ML IJ SOLN: 0.2500 mg | Freq: Once | INTRAMUSCULAR | Status: DC | PRN

## 2018-02-04 NOTE — Anesthesia Procedure Notes (Signed)
Epidural Patient location during procedure: OB  Staffing Anesthesiologist: Raquelle Pietro, MD Performed: anesthesiologist   Preanesthetic Checklist Completed: patient identified, site marked, surgical consent, pre-op evaluation, timeout performed, IV checked, risks and benefits discussed and monitors and equipment checked  Epidural Patient position: sitting Prep: ChloraPrep Patient monitoring: heart rate, continuous pulse ox and blood pressure Approach: midline Location: L4-L5 Injection technique: LOR saline  Needle:  Needle type: Tuohy  Needle gauge: 18 G Needle length: 9 cm and 9 Catheter type: closed end flexible Catheter size: 20 Guage Test dose: negative and 1.5% lidocaine with Epi 1:200 K  Assessment Sensory level: T10 Events: blood not aspirated, injection not painful, no injection resistance, negative IV test and no paresthesia  Additional Notes   Patient tolerated the insertion well without complications.Reason for block:procedure for pain     

## 2018-02-04 NOTE — Progress Notes (Signed)
   Subjective:  Comfortable  Objective:   Vitals: Blood pressure 119/75, pulse 89, temperature 98.4 F (36.9 C), temperature source Axillary, resp. rate 16, height 5\' 7"  (1.702 m), weight 95.3 kg, last menstrual period 05/03/2017. General: NAD Abdomen: gravid, non-tender Cervical Exam:  Dilation: 2 Effacement (%): 50 Cervical Position: Middle Station: -2 Presentation: Vertex Exam by:: Bonney Aid, MD Foley bulb placed  FHT: 150, moderate, +accels, no decels Toco: q62min  Results for orders placed or performed during the hospital encounter of 02/04/18 (from the past 24 hour(s))  CBC     Status: Abnormal   Collection Time: 02/04/18  9:06 AM  Result Value Ref Range   WBC 10.7 (H) 4.0 - 10.5 K/uL   RBC 4.73 3.87 - 5.11 MIL/uL   Hemoglobin 12.6 12.0 - 15.0 g/dL   HCT 16.1 09.6 - 04.5 %   MCV 79.9 (L) 80.0 - 100.0 fL   MCH 26.6 26.0 - 34.0 pg   MCHC 33.3 30.0 - 36.0 g/dL   RDW 40.9 81.1 - 91.4 %   Platelets 174 150 - 400 K/uL   nRBC 0.0 0.0 - 0.2 %  Type and screen [facility]     Status: None   Collection Time: 02/04/18  9:06 AM  Result Value Ref Range   ABO/RH(D) A POS    Antibody Screen NEG    Sample Expiration      02/07/2018 Performed at [facility], [address] [place], [place] [address]     Assessment:   27 y.o. G2P0010 [redacted]w[redacted]d elective IOL  Plan:   1) Labor - foley bulb placed start pitocin  2) Fetus - cat I tracing  Vena Austria, MD, [facility], [facility] 02/04/2018, 2:42 PM

## 2018-02-04 NOTE — Progress Notes (Signed)
L&D Progress Note \  S: Feeling some cramping. BAby active  O: BP 125/82 (BP Location: Left Arm)   Pulse 95   Temp 98.1 F (36.7 C) (Oral)   Resp 16   Ht 5\' 7"  (1.702 m)   Wt 95.3 kg   LMP 05/03/2017 (Exact Date)   BMI 32.89 kg/m   General: gravid WF in NAD  FHR: 150 baseline with accelerations to 180s, moderate variability Toco: uterine irritability, occasional contraction  Cervix: 1+/60%/-2/mid/soft   A: Elective IOL at 39wk6d Cat 1 tracing  P: Cytotec 25 mcg inserted vaginally Consider foley balloon at next cervical check  Farrel Conners, CNM

## 2018-02-04 NOTE — Anesthesia Preprocedure Evaluation (Signed)
Anesthesia Evaluation  Patient identified by MRN, date of birth, ID band Patient awake    Reviewed: Allergy & Precautions, NPO status , Patient's Chart, lab work & pertinent test results, reviewed documented beta blocker date and time   Airway Mallampati: II  TM Distance: >3 FB     Dental  (+) Chipped   Pulmonary           Cardiovascular      Neuro/Psych  Headaches,    GI/Hepatic   Endo/Other    Renal/GU      Musculoskeletal   Abdominal   Peds  Hematology   Anesthesia Other Findings   Reproductive/Obstetrics                             Anesthesia Physical Anesthesia Plan  ASA: II  Anesthesia Plan: Epidural   Post-op Pain Management:    Induction:   PONV Risk Score and Plan:   Airway Management Planned:   Additional Equipment:   Intra-op Plan:   Post-operative Plan:   Informed Consent: I have reviewed the patients History and Physical, chart, labs and discussed the procedure including the risks, benefits and alternatives for the proposed anesthesia with the patient or authorized representative who has indicated his/her understanding and acceptance.     Plan Discussed with: CRNA  Anesthesia Plan Comments:         Anesthesia Quick Evaluation  

## 2018-02-04 NOTE — Progress Notes (Signed)
   Subjective:  Feeling increasing strength to contractions  Objective:   Vitals: Blood pressure 123/76, pulse 84, temperature 98.2 F (36.8 C), temperature source Oral, resp. rate 16, height 5\' 7"  (1.702 m), weight 95.3 kg, last menstrual period 05/03/2017. General: NAD Abdomen: Gravid. Non-tender Cervical Exam:  Dilation: 4 Effacement (%): 70 Cervical Position: Middle Station: -1 Presentation: Vertex Exam by:: Delice Lesch, MD  FHT: 150, moderate, +accels, no decels Toco: q2-44min  Results for orders placed or performed during the hospital encounter of 02/04/18 (from the past 24 hour(s))  CBC     Status: Abnormal   Collection Time: 02/04/18  9:06 AM  Result Value Ref Range   WBC 10.7 (H) 4.0 - 10.5 K/uL   RBC 4.73 3.87 - 5.11 MIL/uL   Hemoglobin 12.6 12.0 - 15.0 g/dL   HCT 16.1 09.6 - 04.5 %   MCV 79.9 (L) 80.0 - 100.0 fL   MCH 26.6 26.0 - 34.0 pg   MCHC 33.3 30.0 - 36.0 g/dL   RDW 40.9 81.1 - 91.4 %   Platelets 174 150 - 400 K/uL   nRBC 0.0 0.0 - 0.2 %  Type and screen [facility]     Status: None   Collection Time: 02/04/18  9:06 AM  Result Value Ref Range   ABO/RH(D) A POS    Antibody Screen NEG    Sample Expiration      02/07/2018 Performed at [facility], [address] [place], [place] [address]     Assessment:   27 y.o. G2P0010 [redacted]w[redacted]d elective IOL  Plan:   1) Labor - AROM clear, continue to titrate pitocin  2) Fetus - cat I tracing  Vena Austria, MD, [facility], [facility] 02/04/2018, 7:29 PM

## 2018-02-04 NOTE — H&P (Signed)
Obstetric H&P   Chief Complaint: Planned IOL  Prenatal Care Provider: [facility]  History of Present Illness: 27 y.o. G2P0010 [redacted]w[redacted]d by 02/05/2018, by embryo transfer date presenting to L&D for elective IOL.  +FM, no LOF, no VB, no ctx.  Pregnancy thus far uncomplicated.  IVF conception.     Pregravid weight 76.2 kg Total Weight Gain 19.1 kg  pregnancy2 Problems (from 05/03/17 to present)    Problem Noted Resolved   Encounter for supervision of normal first pregnancy in first trimester 07/24/2017 by Natale Milch, MD No   Overview Addendum 01/29/2018  2:59 PM by Natale Milch, MD      [facility] Prenatal Labs  Dating  IVF FET 5 day blastocyst 05/20/17 Blood type: A/Positive/-- (04/15 1626)   Genetic Screen Declines, NATERA 27 negative (includes SMA, Fragile-X, and CF) Antibody:Negative (04/15 1626)  Anatomic [place] Complete 11/01/17, normal cardiac anatomy no fetal echo Rubella: 1.83 (04/15 1626) Varicella: Immune  GTT Early: collected 07/24/17       28 wk:121      RPR: Non Reactive (04/15 1626)   Rhogam N/A HBsAg: Negative (04/15 1626)   TDaP vaccine 11/29/17           HIV: Non Reactive (04/15 1626)   Flu Shot 12/13/17                             GBS:  negative  Contraception Discussed- undecided Pap: 2019 NIL  CBB     CS/VBAC N/A   Baby Food  Breast   Support Person  Alinda Money               Review of Systems: 10 point review of systems negative unless otherwise noted in HPI  Past Medical History: Past Medical History:  Diagnosis Date  . Migraine with aura   . PCOS (polycystic ovarian syndrome)     Past Surgical History: Past Surgical History:  Procedure Laterality Date  . CHOLECYSTECTOMY N/A 02/22/2014   Procedure: LAPAROSCOPIC CHOLECYSTECTOMY WITH INTRAOPERATIVE CHOLANGIOGRAM;  Surgeon: Chevis Pretty III, MD;  Location: [facility] OR;  Service: General;  Laterality: N/A;  . EYE SURGERY     lasik  2014    Past Obstetric History: #: 1, Date: 03/16/17, Sex: None,  Weight: None, GA: None, Delivery: Spontaneous Abortion, Apgar1: None, Apgar5: None, Living: None, Birth Comments: None  #: 2, Date: None, Sex: None, Weight: None, GA: None, Delivery: None, Apgar1: None, Apgar5: None, Living: None, Birth Comments: None  Family History: Family History  Problem Relation Age of Onset  . Skin cancer Mother   . Breast cancer Maternal Grandmother   . Skin cancer Maternal Grandmother   . Stomach cancer Paternal Grandfather     Social History: Social History   Socioeconomic History  . Marital status: Married    Spouse name: Not on file  . Number of children: Not on file  . Years of education: Not on file  . Highest education level: Not on file  Occupational History  . Not on file  Social Needs  . Financial resource strain: Not on file  . Food insecurity:    Worry: Not on file    Inability: Not on file  . Transportation needs:    Medical: Not on file    Non-medical: Not on file  Tobacco Use  . Smoking status: Never Smoker  . Smokeless tobacco: Never Used  Substance and Sexual Activity  . Alcohol use: Yes  Alcohol/week: 1.0 standard drinks    Types: 1 Cans of beer per week    Comment: occasionally  . Drug use: No  . Sexual activity: Yes  Lifestyle  . Physical activity:    Days per week: Not on file    Minutes per session: Not on file  . Stress: Not on file  Relationships  . Social connections:    Talks on phone: Not on file    Gets together: Not on file    Attends religious service: Not on file    Active member of club or organization: Not on file    Attends meetings of clubs or organizations: Not on file    Relationship status: Not on file  . Intimate partner violence:    Fear of current or ex partner: Not on file    Emotionally abused: Not on file    Physically abused: Not on file    Forced sexual activity: Not on file  Other Topics Concern  . Not on file  Social History Narrative  . Not on file    Medications: Prior to  Admission medications   Medication Sig Start Date End Date Taking? Authorizing Provider  omeprazole (PRILOSEC) 40 MG capsule Take 1 capsule (40 mg total) by mouth daily. 08/21/17  Yes Oswaldo Conroy, CNM  Prenatal Vit-Fe Fumarate-FA (PRENATAL MULTIVITAMIN) TABS tablet Take 1 tablet by mouth daily at 12 noon.   Yes [provider]  Topiramate ER (TROKENDI XR) 25 MG CP24 Take by mouth. 12/20/17   [provider]    Allergies: Allergies  Allergen Reactions  . Penicillins Hives and Other (See Comments)    Has patient had a PCN reaction causing immediate rash, facial/tongue/throat swelling, SOB or lightheadedness with hypotension: Unknown Has patient had a PCN reaction causing severe rash involving mucus membranes or skin necrosis: Unknown Has patient had a PCN reaction that required hospitalization: Unknown Has patient had a PCN reaction occurring within the last 10 years: Unknown If all of the above answers are "NO", then may proceed with Cephalosporin use.  All medication that are Cillins     Physical Exam: Vitals: Blood pressure 125/82, pulse 95, temperature 98.1 F (36.7 C), temperature source Oral, resp. rate 16, height 5\' 7"  (1.702 m), weight 95.3 kg, last menstrual period 05/03/2017.  Urine Dip Protein: N/A  FHT: 150, moderate, +accels, no decels Toco: none  General: NAD HEENT: normocephalic, anicteric Pulmonary: No increased work of breathing Cardiovascular: RRR, distal pulses 2+ Abdomen: Gravid, non-tender Leopolds: vtx Extremities: no edema, erythema, or tenderness Neurologic: Grossly intact Psychiatric: mood appropriate, affect full  Labs: No results found for this or any previous visit (from the past 24 hour(s)).  Assessment: 27 y.o. G2P0010 [redacted]w[redacted]d by 02/05/2018, by embryo transfer date presenting for elective IOL  Plan: 1) IOL - proceed with cytotec.  The ARRIVE study was a national multicenter trial that randomized 6,106 patients to induction  of labor at 39 weeks 0 days to 39 weeks 4 days (3,062) compared to expectant management (3,044).  There was no significant difference in adverse perinatal outcomes but there was a significantly lower rate of cesarean delivery, as well as lower rate of maternal hypertensive complications in the induction group.  Number to treat was calculated as 28 induction of labor to prevent 1 primary Cesarean section.  "Labor Induction versus Expectant Management in Nulliparous Low-Risk Women" The [place] Journal of Medicine iAugust 9, 2018 Vol. 379 No. 6   2) Fetus - cat I tracing  3) PNL - Blood type A/Positive/-- (04/15 1626) / Anti-bodyscreen Negative (04/15 1626) / Rubella 1.83 (04/15 1626) / Varicella Immune / RPR Non Reactive (08/16 1610) / HBsAg Negative (04/15 1626) / HIV Non Reactive (08/16 0923) / 1-hr OGTT 121 / GBS negative  4) Immunization History -  Immunization History  Administered Date(s) Administered  . Influenza,inj,Quad PF,6+ Mos 12/13/2017  . Tdap 11/29/2017    5) Disposition - pending delivery  Vena Austria, MD, [facility], [facility] 02/04/2018, 9:02 AM

## 2018-02-05 ENCOUNTER — Inpatient Hospital Stay: Admit: 2018-02-05 | Payer: Self-pay

## 2018-02-05 DIAGNOSIS — Z3A39 39 weeks gestation of pregnancy: Secondary | ICD-10-CM

## 2018-02-05 LAB — RPR: RPR Ser Ql: NONREACTIVE

## 2018-02-05 MED ORDER — COCONUT OIL OIL
1.0000 "application " | TOPICAL_OIL | Status: DC | PRN
  Administered 2018-02-06: 1 via TOPICAL
  Filled 2018-02-05: qty 120

## 2018-02-05 MED ORDER — ACETAMINOPHEN 325 MG PO TABS: 650.0000 mg | ORAL_TABLET | ORAL | Status: DC | PRN

## 2018-02-05 MED ORDER — OXYCODONE HCL 5 MG PO TABS: 5.0000 mg | ORAL_TABLET | ORAL | Status: DC | PRN

## 2018-02-05 MED ORDER — SIMETHICONE 80 MG PO CHEW: 80.0000 mg | CHEWABLE_TABLET | ORAL | Status: DC | PRN

## 2018-02-05 MED ORDER — BENZOCAINE-MENTHOL 20-0.5 % EX AERO
1.0000 "application " | INHALATION_SPRAY | CUTANEOUS | Status: DC | PRN
  Filled 2018-02-05: qty 56

## 2018-02-05 MED ORDER — PRENATAL MULTIVITAMIN CH
1.0000 | ORAL_TABLET | Freq: Every day | ORAL | Status: DC
  Administered 2018-02-06: 1 via ORAL
  Filled 2018-02-05: qty 1

## 2018-02-05 MED ORDER — ONDANSETRON HCL 4 MG/2ML IJ SOLN: 4.0000 mg | INTRAMUSCULAR | Status: DC | PRN

## 2018-02-05 MED ORDER — SENNOSIDES-DOCUSATE SODIUM 8.6-50 MG PO TABS
2.0000 | ORAL_TABLET | ORAL | Status: DC
  Filled 2018-02-05: qty 2

## 2018-02-05 MED ORDER — IBUPROFEN 600 MG PO TABS
600.0000 mg | ORAL_TABLET | Freq: Four times a day (QID) | ORAL | Status: DC
  Administered 2018-02-05: 600 mg via ORAL
  Filled 2018-02-05: qty 1

## 2018-02-05 MED ORDER — IBUPROFEN 600 MG PO TABS
600.0000 mg | ORAL_TABLET | Freq: Four times a day (QID) | ORAL | Status: DC
  Administered 2018-02-05 – 2018-02-06 (×4): 600 mg via ORAL
  Filled 2018-02-05 (×4): qty 1

## 2018-02-05 MED ORDER — OXYCODONE HCL 5 MG PO TABS: 10.0000 mg | ORAL_TABLET | ORAL | Status: DC | PRN

## 2018-02-05 MED ORDER — MISOPROSTOL 200 MCG PO TABS
800.0000 ug | ORAL_TABLET | Freq: Once | ORAL | Status: AC
  Administered 2018-02-05: 800 ug via RECTAL

## 2018-02-05 MED ORDER — DIBUCAINE 1 % RE OINT
1.0000 "application " | TOPICAL_OINTMENT | RECTAL | Status: DC | PRN
  Filled 2018-02-05: qty 28

## 2018-02-05 MED ORDER — DIPHENHYDRAMINE HCL 25 MG PO CAPS: 25.0000 mg | ORAL_CAPSULE | Freq: Four times a day (QID) | ORAL | Status: DC | PRN

## 2018-02-05 MED ORDER — ONDANSETRON HCL 4 MG PO TABS: 4.0000 mg | ORAL_TABLET | ORAL | Status: DC | PRN

## 2018-02-05 MED ORDER — WITCH HAZEL-GLYCERIN EX PADS
1.0000 "application " | MEDICATED_PAD | CUTANEOUS | Status: DC | PRN
  Filled 2018-02-05: qty 100

## 2018-02-05 MED ORDER — OXYTOCIN 40 UNITS IN LACTATED RINGERS INFUSION - SIMPLE MED
INTRAVENOUS | Status: AC
  Administered 2018-02-05: 15:00:00
  Filled 2018-02-05: qty 1000

## 2018-02-05 NOTE — Lactation Note (Signed)
This note was copied from a baby's chart. Lactation Consultation Note  Patient Name: Girl Mariko Nowakowski Today's Date: 02/05/2018   During [facility] rounds, Mom stated that baby Faith had "breastfed well" for 20 minutes before I arrived. Baby was skin to skin and content. I reviewed basic BF info and explained LC services and support here at [facility]. She denied having other needs or questions at the time. She will need good LC F/U due to hx PCOS (on Metformin).      Maternal Data Does the patient have breastfeeding experience prior to this delivery?: No  Feeding Feeding Type: Breast Fed  LATCH Score Latch: Repeated attempts needed to sustain latch, nipple held in mouth throughout feeding, stimulation needed to elicit sucking reflex.  Audible Swallowing: A few with stimulation  Type of Nipple: Flat  Comfort (Breast/Nipple): Soft / non-tender  Hold (Positioning): Assistance needed to correctly position infant at breast and maintain latch.  LATCH Score: 6  Interventions Interventions: Breast feeding basics reviewed;Assisted with latch;Skin to skin;Hand express;Adjust position;Support pillows  Lactation Tools Discussed/Used     Consult Status      Sunday Corn 02/05/2018, 2:43 PM

## 2018-02-05 NOTE — Progress Notes (Signed)
  Labor Progress Note   27 y.o. G2P0010 @ [redacted]w[redacted]d , admitted for  Pregnancy, Labor Management.   Subjective:  Comfortable with epidural. She has some awareness of pressure.  Objective:  BP 135/72   Pulse 92   Temp 98.4 F (36.9 C) (Oral)   Resp 18   Ht 5\' 7"  (1.702 m)   Wt 95.3 kg   LMP 05/03/2017 (Exact Date)   SpO2 98%   BMI 32.89 kg/m  Abd: mild Extr: trace to 1+ bilateral pedal edema SVE: CERVIX: 9.5 cm dilated, 100 effaced, +1 to +2 station Attempted push during exam- no progress  EFM: FHR: 140 bpm, variability: moderate,  accelerations:  Present,  decelerations:  Absent Toco: Frequency: Every 2-3 minutes Labs: I have reviewed the patient's lab results.   Assessment & Plan:  G2P0010 @ 101w0d, admitted for  Pregnancy and Labor/Delivery Management  1. Pain management: epidural. 2. FWB: FHT category I.  3. ID: GBS negative 4. Labor management: Labor down  All discussed with patient, see orders   Tresea Mall, CNM [facility], [facility] 02/05/2018  9:32 AM

## 2018-02-05 NOTE — Discharge Summary (Signed)
OB Discharge Summary     Patient Name: Jessica Johnson DOB: June 09, 1990 MRN: [id number]  Date of admission: 02/04/2018 Delivering Provider: Tresea Mall, CNM  Date of Delivery: 02/05/2018  Date of discharge: 02/06/2018  Admitting diagnosis: 39 weeks;induction Intrauterine pregnancy: [redacted]w[redacted]d     Secondary diagnosis: None     Discharge diagnosis: Term Pregnancy Delivered                                                                                                Post partum procedures: Cytotec 800 mcg PR for brisk bleeding, repair of 2nd degree vaginal laceration  Augmentation: AROM, Pitocin, Cytotec and Foley Balloon  Complications: None  Hospital course:  Induction of Labor With Vaginal Delivery   27 y.o. yo G2P0010 at [redacted]w[redacted]d was admitted to the hospital 02/04/2018 for induction of labor.   Indication for induction: Elective induction.  Patient had an uncomplicated labor course as follows: Membrane Rupture Time/Date: 7:09 PM ,02/04/2018   Patient had delivery of viable female 12:09 PM, 02/05/2018 Details of delivery can be found in separate delivery note.   Patient had a routine postpartum course. Patient is discharged home on 02/06/2018 in good condition.  Physical exam  Vitals:   02/05/18 1925 02/05/18 2300 02/06/18 0300 02/06/18 0853  BP: 123/84 109/64 103/63 102/64  Pulse: 97 90 72 84  Resp: 18 18 16 18   Temp: 98.1 F (36.7 C) 98 F (36.7 C) 97.6 F (36.4 C) (!) 97.5 F (36.4 C)  TempSrc: Oral Oral Oral Oral  SpO2: 99% 97% 98% 97%  Weight:      Height:       General: alert, cooperative and no distress Lochia: appropriate Uterine Fundus: firm Incision: N/A DVT Evaluation: No evidence of DVT seen on physical exam.  Labs: Lab Results  Component Value Date   WBC 16.3 (H) 02/06/2018   HGB 8.6 (L) 02/06/2018   HCT 25.8 (L) 02/06/2018   MCV 80.6 02/06/2018   PLT 164 02/06/2018    Discharge instruction: per After Visit Summary.  Medications:  Allergies as of  02/06/2018      Reactions   Penicillins Hives, Other (See Comments)   Has patient had a PCN reaction causing immediate rash, facial/tongue/throat swelling, SOB or lightheadedness with hypotension: Unknown Has patient had a PCN reaction causing severe rash involving mucus membranes or skin necrosis: Unknown Has patient had a PCN reaction that required hospitalization: Unknown Has patient had a PCN reaction occurring within the last 10 years: Unknown If all of the above answers are "NO", then may proceed with Cephalosporin use. All medication that are Cillins      Medication List    TAKE these medications   ferrous sulfate 325 (65 FE) MG tablet Take 1 tablet (325 mg total) by mouth 2 (two) times daily.   omeprazole 40 MG capsule Commonly known as:  PRILOSEC Take 1 capsule (40 mg total) by mouth daily.   prenatal multivitamin Tabs tablet Take 1 tablet by mouth daily at 12 noon.   TROKENDI XR 25 MG Cp24 Generic drug:  Topiramate ER Take by mouth.  Diet: routine diet  Activity: Advance as tolerated. Pelvic rest for 6 weeks.   Outpatient follow up: Follow-up Information    Alysha, Doolan, CNM. Schedule an appointment as soon as possible for a visit in 6 week(s).   Specialty:  Obstetrics Why:  postpartum follow up Contact information: [address] [place] [address] [phone]             Postpartum contraception: Progesterone only pills (considering) Rhogam Given postpartum: NA Rubella vaccine given postpartum: Rubella Immune Varicella vaccine given postpartum: Varicella Immune TDaP given antepartum or postpartum: Yes  Newborn Data: Live born female  Birth Weight:   APGAR: 9, 9  Newborn Delivery   Birth date/time:  02/05/2018 12:09:00 Delivery type:  Vaginal, Spontaneous      Baby Feeding: Breast  Disposition: home with mother  SIGNED:  Oswaldo Conroy, CNM 02/06/2018 9:41 AM

## 2018-02-05 NOTE — Discharge Instructions (Signed)
Breast Pumping Tips °If you are breastfeeding, there may be times when you cannot feed your baby directly. Returning to work or going on a trip are common examples. Pumping allows you to store breast milk and feed it to your baby later. °You may not get much milk when you first start to pump. Your breasts should start to make more after a few days. If you pump at the times you usually feed your baby, you may be able to keep making enough milk to feed your baby without also using formula. The more often you pump, the more milk you will produce. °When should I pump? °· You can begin to pump soon after delivery. However, some experts recommend waiting about 4 weeks before giving your infant a bottle to make sure breastfeeding is going well. °· If you plan to return to work, begin pumping a few weeks before. This will help you develop techniques that work best for you. It also lets you build up a supply of breast milk. °· When you are with your infant, feed on demand and pump after each feeding. °· When you are away from your infant for several hours, pump for about 15 minutes every 2-3 hours. Pump both breasts at the same time if you can. °· If your infant has a formula feeding, make sure to pump around the same time. °· If you drink any alcohol, wait 2 hours before pumping. °How do I prepare to pump? °Your let-down reflex is the natural reaction to stimulation that makes your breast milk flow. It is easier to stimulate this reflex when you are relaxed. Find relaxation techniques that work for you. If you have difficulty with your let-down reflex, try these methods: °· Smell one of your infant's blankets or an item of clothing. °· Look at a picture or video of your infant. °· Sit in a quiet, private space. °· Massage the breast you plan to pump. °· Place soothing warmth on the breast. °· Play relaxing music. ° °What are some general breast pumping tips? °· Wash your hands before you pump. You do not need to wash your  nipples or breasts. °· There are three ways to pump. °? You can use your hand to massage and compress your breast. °? You can use a handheld manual pump. °? You can use an electric pump. °· Make sure the suction cup (flange) on the breast pump is the right size. Place the flange directly over the nipple. If it is the wrong size or placed the wrong way, it may be painful and cause nipple damage. °· If pumping is uncomfortable, apply a small amount of purified or modified lanolin to your nipple and areola. °· If you are using an electric pump, adjust the speed and suction power to be more comfortable. °· If pumping is painful or if you are not getting very much milk, you may need a different type of pump. A lactation consultant can help you determine what type of pump to use. °· Keep a full water bottle near you at all times. Drinking lots of fluid helps you make more milk. °· You can store your milk to use later. Pumped breast milk can be stored in a sealable, sterile container or plastic bag. Label all stored breast milk with the date you pumped it. °? Milk can stay out at room temperature for up to 8 hours. °? You can store your milk in the refrigerator for up to 8 days. °? You can   store your milk in the freezer for 3 months. Thaw frozen milk using warm water. Do not put it in the microwave. °· Do not smoke. Smoking can lower your milk supply and harm your infant. If you need help quitting, ask your health care provider to recommend a program. °When should I call my health care provider or a lactation consultant? °· You are having trouble pumping. °· You are concerned that you are not making enough milk. °· You have nipple pain, soreness, or redness. °· You want to use birth control. Birth control pills may lower your milk supply. Talk to your health care provider about your options. °This information is not intended to replace advice given to you by your health care provider. Make sure you discuss any questions  you have with your health care provider. °Document Released: 09/06/2009 Document Revised: 08/31/2015 Document Reviewed: 01/09/2013 °Elsevier Interactive Patient Education © 2017 Elsevier Inc. ° °

## 2018-02-06 LAB — CBC
HEMATOCRIT: 25.8 % — AB (ref 36.0–46.0)
HEMOGLOBIN: 8.6 g/dL — AB (ref 12.0–15.0)
MCH: 26.9 pg (ref 26.0–34.0)
MCHC: 33.3 g/dL (ref 30.0–36.0)
MCV: 80.6 fL (ref 80.0–100.0)
NRBC: 0 % (ref 0.0–0.2)
Platelets: 164 10*3/uL (ref 150–400)
RBC: 3.2 MIL/uL — ABNORMAL LOW (ref 3.87–5.11)
RDW: 14.5 % (ref 11.5–15.5)
WBC: 16.3 10*3/uL — AB (ref 4.0–10.5)

## 2018-02-06 MED ORDER — FERROUS SULFATE 325 (65 FE) MG PO TABS: 325.0000 mg | ORAL_TABLET | Freq: Two times a day (BID) | ORAL | 1 refills | Status: DC

## 2018-02-06 NOTE — Lactation Note (Signed)
This note was copied from a baby's chart. Lactation Consultation Note  Patient Name: Jessica Johnson Today's Date: 02/06/2018 Reason for consult: Initial assessment   Maternal Data    Feeding Feeding Type: Breast Fed  LATCH Score Latch: Grasps breast easily, tongue down, lips flanged, rhythmical sucking.  Audible Swallowing: A few with stimulation  Type of Nipple: Everted at rest and after stimulation  Comfort (Breast/Nipple): Soft / non-tender  Hold (Positioning): No assistance needed to correctly position infant at breast.  LATCH Score: 9  Interventions Interventions: Breast feeding basics reviewed;Coconut oil  Lactation Tools Discussed/Used     Consult Status  Mother states that baby Jessica Johnson is doing well breastfeeding and denies any pain or discomfort at this time. Mother has a Spectra breast pump at home. LC discussed the basics of correct latch, clusterfeeding and frequency of wet and dirty diapers to expect. Coconut oil given to mother per her request. Mother states she knows about the lactation outpatient clinic and the breastfeeding support group at [facility] and denies any additional questions or concerns at this time.    Arlyss Gandy 02/06/2018, 10:31 AM

## 2018-02-06 NOTE — Anesthesia Postprocedure Evaluation (Signed)
Anesthesia Post Note  Patient: Jessica Johnson  Procedure(s) Performed: AN AD HOC LABOR EPIDURAL  Patient location during evaluation: Mother Baby Anesthesia Type: Epidural Level of consciousness: awake and alert and oriented Pain management: pain level controlled Vital Signs Assessment: post-procedure vital signs reviewed and stable Respiratory status: respiratory function stable Cardiovascular status: stable Postop Assessment: no headache, no backache, epidural receding, patient able to bend at knees, no apparent nausea or vomiting, able to ambulate and adequate PO intake Anesthetic complications: no     Last Vitals:  Vitals:   02/05/18 2300 02/06/18 0300  BP: 109/64 103/63  Pulse: 90 72  Resp: 18 16  Temp: 36.7 C 36.4 C  SpO2: 97% 98%    Last Pain:  Vitals:   02/06/18 0300  TempSrc: Oral  PainSc:                  Clydene Pugh

## 2018-02-17 ENCOUNTER — Telehealth: Payer: Self-pay

## 2018-02-17 NOTE — Telephone Encounter (Signed)
Pt's FMLA coordinator adv her to put in a request to us for her papers to state her last day at work is 01/30/18, her first day out of work 01/31/18 and she will be out 12wks.  Send to Rosemarie AxErica Gembola at Fax# [phone].  Pt's CB# is [phone]

## 2018-02-18 ENCOUNTER — Ambulatory Visit (INDEPENDENT_AMBULATORY_CARE_PROVIDER_SITE_OTHER): Payer: 59 | Admitting: Primary Care

## 2018-02-18 ENCOUNTER — Encounter: Payer: Self-pay | Admitting: Primary Care

## 2018-02-18 DIAGNOSIS — K219 Gastro-esophageal reflux disease without esophagitis: Secondary | ICD-10-CM

## 2018-02-18 DIAGNOSIS — G43909 Migraine, unspecified, not intractable, without status migrainosus: Secondary | ICD-10-CM

## 2018-02-18 NOTE — Assessment & Plan Note (Signed)
Long history of headaches/migraines. Following with Neurology. Will be restarting Topiramate within the next week. She is breast feeding.

## 2018-02-18 NOTE — Progress Notes (Signed)
Subjective:    Patient ID: Jessica Johnson, female    DOB: May 31, 1990, 27 y.o.   MRN: [id number]  HPI  Ms. Jessica Johnson is a 27 year old female who presents today to establish care and discuss the problems mentioned below. Will obtain old records.  1) Migraines/Frequent Headaches: Currently prescribed topiramate ER 25 mg for which she took for about four years, has not restarted due to being post partum 11 days ago. She plans on resuming within the next week pending insurance approval. She is following with neurology.   2) GERD: Currently managed on omeprazole 40 mg for which she's taken PRN with for the last 6 years. Will typically take 1-2 times weekly on average.   Review of Systems  Respiratory: Negative for shortness of breath.   Cardiovascular: Negative for chest pain.  Gastrointestinal:       Intermittent GERD  Neurological:       Intermittent headaches       Past Medical History:  Diagnosis Date  . Migraine with aura   . PCOS (polycystic ovarian syndrome)      Social History   Socioeconomic History  . Marital status: Married    Spouse name: Not on file  . Number of children: Not on file  . Years of education: Not on file  . Highest education level: Not on file  Occupational History  . Not on file  Social Needs  . Financial resource strain: Not on file  . Food insecurity:    Worry: Not on file    Inability: Not on file  . Transportation needs:    Medical: Not on file    Non-medical: Not on file  Tobacco Use  . Smoking status: Never Smoker  . Smokeless tobacco: Never Used  Substance and Sexual Activity  . Alcohol use: Yes    Alcohol/week: 1.0 standard drinks    Types: 1 Cans of beer per week    Comment: occasionally  . Drug use: No  . Sexual activity: Yes  Lifestyle  . Physical activity:    Days per week: Not on file    Minutes per session: Not on file  . Stress: Not on file  Relationships  . Social connections:    Talks on phone: Not on file    Gets  together: Not on file    Attends religious service: Not on file    Active member of club or organization: Not on file    Attends meetings of clubs or organizations: Not on file    Relationship status: Not on file  . Intimate partner violence:    Fear of current or ex partner: Not on file    Emotionally abused: Not on file    Physically abused: Not on file    Forced sexual activity: Not on file  Other Topics Concern  . Not on file  Social History Narrative   Married.   1 child.   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    Past Surgical History:  Procedure Laterality Date  . CHOLECYSTECTOMY N/A 02/22/2014   Procedure: LAPAROSCOPIC CHOLECYSTECTOMY WITH INTRAOPERATIVE CHOLANGIOGRAM;  Surgeon: Chevis PrettyPaul Toth III, MD;  Location: [facility] OR;  Service: General;  Laterality: N/A;  . EYE SURGERY     lasik  2014    Family History  Problem Relation Age of Onset  . Skin cancer Mother   . Breast cancer Maternal Grandmother   . Skin cancer Maternal Grandmother   . Stomach cancer Paternal Grandfather  Allergies  Allergen Reactions  . Penicillins Hives and Other (See Comments)    Has patient had a PCN reaction causing immediate rash, facial/tongue/throat swelling, SOB or lightheadedness with hypotension: Unknown Has patient had a PCN reaction causing severe rash involving mucus membranes or skin necrosis: Unknown Has patient had a PCN reaction that required hospitalization: Unknown Has patient had a PCN reaction occurring within the last 10 years: Unknown If all of the above answers are "NO", then may proceed with Cephalosporin use.  All medication that are Cillins     Current Outpatient Medications on File Prior to Visit  Medication Sig Dispense Refill  . ferrous sulfate (FERROUSUL) 325 (65 FE) MG tablet Take 1 tablet (325 mg total) by mouth 2 (two) times daily. 60 tablet 1  . omeprazole (PRILOSEC) 40 MG capsule Take 1 capsule (40 mg total) by mouth daily. 1 capsule 6  . Prenatal Vit-Fe Fumarate-FA  (PRENATAL MULTIVITAMIN) TABS tablet Take 1 tablet by mouth daily at 12 noon.    . Topiramate ER (TROKENDI XR) 25 MG CP24 Take by mouth.     No current facility-administered medications on file prior to visit.     BP 104/70   Pulse 86   Temp 98.3 F (36.8 C) (Oral)   Ht 5\' 6"  (1.676 m)   Wt 181 lb 12 oz (82.4 kg)   SpO2 98%   Breastfeeding? Yes   BMI 29.34 kg/m    Objective:   Physical Exam  Constitutional: She appears well-nourished.  Neck: Neck supple.  Cardiovascular: Normal rate and regular rhythm.  Respiratory: Effort normal and breath sounds normal.  Skin: Skin is warm and dry.  Psychiatric: She has a normal mood and affect.           Assessment & Plan:

## 2018-02-18 NOTE — Patient Instructions (Signed)
It was a pleasure to meet you today! Please don't hesitate to call or message me with any questions. Welcome to [facility]!   

## 2018-02-18 NOTE — Assessment & Plan Note (Signed)
Intermittent. Managed on omeprazole PRN. Continue same.

## 2018-03-11 ENCOUNTER — Encounter: Payer: Self-pay | Admitting: Advanced Practice Midwife

## 2018-03-11 ENCOUNTER — Ambulatory Visit (INDEPENDENT_AMBULATORY_CARE_PROVIDER_SITE_OTHER): Payer: 59 | Admitting: Advanced Practice Midwife

## 2018-03-11 DIAGNOSIS — Z30011 Encounter for initial prescription of contraceptive pills: Secondary | ICD-10-CM

## 2018-03-11 DIAGNOSIS — Z1389 Encounter for screening for other disorder: Secondary | ICD-10-CM | POA: Diagnosis not present

## 2018-03-11 DIAGNOSIS — D509 Iron deficiency anemia, unspecified: Secondary | ICD-10-CM

## 2018-03-11 LAB — POCT HEMOGLOBIN: HEMOGLOBIN: 12.2 g/dL (ref 9.5–13.5)

## 2018-03-11 MED ORDER — NORETHINDRONE 0.35 MG PO TABS: 1.0000 | ORAL_TABLET | Freq: Every day | ORAL | 11 refills | Status: DC

## 2018-03-11 NOTE — Progress Notes (Signed)
Postpartum Visit  Chief Complaint:  Chief Complaint  Patient presents with  . Postpartum Care    Vaginal delivery 11/6    History of Present Illness: Patient is a 27 y.o. G2P1011 presents for postpartum visit.  Review the Delivery Report for details.   Date of delivery: 02/05/2018 Type of delivery: Vaginal delivery - Vacuum or forceps assisted  no Episiotomy No.  Laceration: 2nd degree with repair  Pregnancy or labor problems:  no Any problems since the delivery:  Some mild tenderness at perineum  Newborn Details:  SINGLETON :  1. BabyGender female. Birth weight:  7 pounds 12.5 ounces, 3530 g Maternal Details:  Breast or formula feeding: breastfeeding Intercourse: No  Contraception after delivery: progesterone-only pill Any bowel or bladder issues: No  Post partum depression/anxiety noted:  no Edinburgh Post-Partum Depression Score: 4 Date of last PAP: 07/15/2017  no abnormalities   Review of Systems: Review of Systems  Constitutional: Negative.   HENT: Negative.   Eyes: Negative.   Respiratory: Negative.   Cardiovascular: Negative.   Gastrointestinal: Negative.   Genitourinary: Negative.   Musculoskeletal: Negative.   Skin: Negative.   Neurological: Negative.   Endo/Heme/Allergies: Negative.   Psychiatric/Behavioral:       Mild postpartum blues     Past Medical History:  Past Medical History:  Diagnosis Date  . Migraine with aura   . PCOS (polycystic ovarian syndrome)     Past Surgical History:  Past Surgical History:  Procedure Laterality Date  . CHOLECYSTECTOMY N/A 02/22/2014   Procedure: LAPAROSCOPIC CHOLECYSTECTOMY WITH INTRAOPERATIVE CHOLANGIOGRAM;  Surgeon: Chevis Pretty III, MD;  Location: [facility] OR;  Service: General;  Laterality: N/A;  . EYE SURGERY     lasik  2014    Family History:  Family History  Problem Relation Age of Onset  . Skin cancer Mother   . Breast cancer Maternal Grandmother   . Skin cancer Maternal Grandmother   . Stomach  cancer Paternal Grandfather     Social History:  Social History   Socioeconomic History  . Marital status: Married    Spouse name: Not on file  . Number of children: Not on file  . Years of education: Not on file  . Highest education level: Not on file  Occupational History  . Not on file  Social Needs  . Financial resource strain: Not on file  . Food insecurity:    Worry: Not on file    Inability: Not on file  . Transportation needs:    Medical: Not on file    Non-medical: Not on file  Tobacco Use  . Smoking status: Never Smoker  . Smokeless tobacco: Never Used  Substance and Sexual Activity  . Alcohol use: Yes    Alcohol/week: 1.0 standard drinks    Types: 1 Cans of beer per week    Comment: occasionally  . Drug use: No  . Sexual activity: Yes  Lifestyle  . Physical activity:    Days per week: Not on file    Minutes per session: Not on file  . Stress: Not on file  Relationships  . Social connections:    Talks on phone: Not on file    Gets together: Not on file    Attends religious service: Not on file    Active member of club or organization: Not on file    Attends meetings of clubs or organizations: Not on file    Relationship status: Not on file  . Intimate partner violence:  Fear of current or ex partner: Not on file    Emotionally abused: Not on file    Physically abused: Not on file    Forced sexual activity: Not on file  Other Topics Concern  . Not on file  Social History Narrative   Married.   1 child.   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    Allergies:  Allergies  Allergen Reactions  . Penicillins Hives and Other (See Comments)    Has patient had a PCN reaction causing immediate rash, facial/tongue/throat swelling, SOB or lightheadedness with hypotension: Unknown Has patient had a PCN reaction causing severe rash involving mucus membranes or skin necrosis: Unknown Has patient had a PCN reaction that required hospitalization: Unknown Has patient had a  PCN reaction occurring within the last 10 years: Unknown If all of the above answers are "NO", then may proceed with Cephalosporin use.  All medication that are Cillins     Medications: Prior to Admission medications   Medication Sig Start Date End Date Taking? Authorizing Provider  norethindrone (MICRONOR,CAMILA,ERRIN) 0.35 MG tablet Take 1 tablet (0.35 mg total) by mouth daily. 03/11/18   Tresea MallGledhill, Chyanna Flock, CNM  omeprazole (PRILOSEC) 40 MG capsule Take 1 capsule (40 mg total) by mouth daily. 08/21/17   Oswaldo ConroySchmid, Jacelyn Y, CNM  Prenatal Vit-Fe Fumarate-FA (PRENATAL MULTIVITAMIN) TABS tablet Take 1 tablet by mouth daily at 12 noon.    [provider]  Topiramate ER (TROKENDI XR) 25 MG CP24 Take by mouth. 12/20/17   [provider]    Physical Exam Blood pressure 108/68, pulse 94, height 5\' 7"  (1.702 m), weight 176 lb (79.8 kg), currently breastfeeding.    General: NAD HEENT: normocephalic, anicteric Pulmonary: No increased work of breathing Abdomen: NABS, soft, non-tender, non-distended.  Umbilicus without lesions.  No hepatomegaly, splenomegaly or masses palpable. No evidence of hernia. Genitourinary:  External: Normal external female genitalia.  Normal urethral meatus, normal  Bartholin's and Skene's glands.    Vagina: Normal vaginal mucosa, no evidence of prolapse. 3 mm of persistent stitch is exposed at perineum, mildly hard, red and tender to the touch    Cervix: Grossly normal in appearance, no bleeding  Uterus: Non-enlarged, mobile, normal contour.  No CMT  Adnexa: ovaries non-enlarged, no adnexal masses  Rectal: deferred Extremities: no edema, erythema, or tenderness Neurologic: Grossly intact Psychiatric: mood appropriate, affect full  Edinburgh Postnatal Depression Scale - 03/11/18 1434      Edinburgh Postnatal Depression Scale:  In the Past 7 Days   I have been able to laugh and see the funny side of things.  0    I have looked forward with enjoyment to  things.  0    I have blamed myself unnecessarily when things went wrong.  1    I have been anxious or worried for no good reason.  1    I have felt scared or panicky for no good reason.  0    Things have been getting on top of me.  0    I have been so unhappy that I have had difficulty sleeping.  0    I have felt sad or miserable.  1    I have been so unhappy that I have been crying.  1    The thought of harming myself has occurred to me.  0    Edinburgh Postnatal Depression Scale Total  4       Assessment: 27 y.o. [id number] presenting for 6 week postpartum visit  Plan: Problem List Items Addressed This Visit    None    Visit Diagnoses    6 weeks postpartum follow-up    -  Primary   Iron deficiency anemia, unspecified iron deficiency anemia type       Relevant Orders   POCT hemoglobin (Completed)   Encounter for initial prescription of contraceptive pills       Relevant Medications   norethindrone (MICRONOR,CAMILA,ERRIN) 0.35 MG tablet       1) Contraception - Education given regarding options for contraception, as well as compatibility with breast feeding if applicable.  Patient plans on oral progesterone-only contraceptive for contraception.  2)  Pap - ASCCP guidelines and rational discussed.  ASCCP guidelines and rational discussed.  Patient opts for every 3 years screening interval  3) Patient underwent screening for postpartum depression with no signs of depression  4) Return to clinic PRN and for annual exam in 1 year   Tresea Mall, CNM [facility], [facility] 03/11/2018, 2:56 PM

## 2018-04-15 NOTE — Telephone Encounter (Signed)
im not understand what she needs from me? Do I need to have them resend me papers because I dont have her paperwork anymore I filled it out in early October, if she just needs a note from the provider I can get that done.

## 2018-09-07 ENCOUNTER — Ambulatory Visit (INDEPENDENT_AMBULATORY_CARE_PROVIDER_SITE_OTHER)
Admission: RE | Admit: 2018-09-07 | Discharge: 2018-09-07 | Disposition: A | Discharge status: Home / Self Care | Payer: BC Managed Care – PPO | Source: Ambulatory Visit

## 2018-09-07 ENCOUNTER — Inpatient Hospital Stay: Admission: RE | Admit: 2018-09-07 | Payer: 59 | Source: Ambulatory Visit

## 2018-09-07 DIAGNOSIS — O9123 Nonpurulent mastitis associated with lactation: Secondary | ICD-10-CM

## 2018-09-07 DIAGNOSIS — M791 Myalgia, unspecified site: Secondary | ICD-10-CM

## 2018-09-07 DIAGNOSIS — R232 Flushing: Secondary | ICD-10-CM | POA: Diagnosis not present

## 2018-09-07 NOTE — ED Provider Notes (Addendum)
Virtual Visit via Video Note:  Jessica Johnson  initiated request for Telemedicine visit with [facility] team. I connected with Jessica Johnson  on 09/07/2018 at 12:44 PM  for a synchronized telemedicine visit using a video enabled HIPPA compliant telemedicine application. I verified that I am speaking with Jessica Johnson  using two identifiers. Jessica Hall-Potvin, PA-C  was physically located in a [facility] site and Jessica Johnson was located at a different location.   The limitations of evaluation and management by telemedicine as well as the availability of in-person appointments were discussed. Patient was informed that she  may incur a bill ( including co-pay) for this virtual visit encounter. Jessica Johnson  expressed understanding and gave verbal consent to proceed with virtual visit.     History of Present Illness:Jessica Johnson  is a 28 y.o. female currently breast-feeding presenting for left breast tenderness patient states that she noticed left lower breast tenderness yesterday.  Today has had some body aches and hot flashes.  Took temperature at home this morning without antipyretic: 98.7 F.  Patient cannot appreciate a lump or boil.  Patient denies nipple changes, breast changes, nipple discharge, nipple retraction, or change in appearance of breast milk.  Patient states that over the last few days she has cut back on how much she is pumping.  Also a few days ago switched from using her machine pump to a hand pump.  Patient has taken Tylenol and use some hot compresses which help provide symptom relief.    Past Medical History:  Diagnosis Date  . Migraine with aura   . PCOS (polycystic ovarian syndrome)     Allergies  Allergen Reactions  . Penicillins Hives and Other (See Comments)    Has patient had a PCN reaction causing immediate rash, facial/tongue/throat swelling, SOB or lightheadedness with hypotension: Unknown Has patient had a PCN reaction causing severe rash  involving mucus membranes or skin necrosis: Unknown Has patient had a PCN reaction that required hospitalization: Unknown Has patient had a PCN reaction occurring within the last 10 years: Unknown If all of the above answers are "NO", then may proceed with Cephalosporin use.  All medication that are Cillins         Observations/Objective:  Alert, well developed female sitting in no acute distress.  Assessment and Plan: 28 year old female currently breast-feeding presenting for left lower breast pain and tenderness.  Due to duration, recent decline in pumping, and lack of systemic symptoms, will not pursue antibiotics at this time, continue with Tylenol and increase frequency of hot compresses in conjunction with massage.  Discussed strict return precautions including worsening of symptoms, or developing fever over the next 24 to 48 hours.  Patient verbalized understanding.  Follow Up Instructions: If no improvement in 1-2 days or fever develops, he will need to be seen in office for further evaluation.   I discussed the assessment and treatment plan with the patient. The patient was provided an opportunity to ask questions and all were answered. The patient agreed with the plan and demonstrated an understanding of the instructions.    The patient was advised to call back or seek an in-person evaluation if the symptoms worsen or if the condition fails to improve as anticipated.  I provided 20 minutes of non-face-to-face time during this encounter.    [place], PA-C  09/07/2018 12:44 PM        Johnson, [place], PA-C 09/07/18 1320    Johnson, Jessica, [place] 09/07/18  1334  

## 2018-09-07 NOTE — Discharge Instructions (Addendum)
Take Tylenol and use hot compresses for 5 minutes at a time for additional relief. May gently massage area, increase pumping frequency, and/or resume use of machine pump. If you develop fever, or symptoms worsen over the next 24 to 48 hours, he should be evaluated in clinic.

## 2018-10-23 ENCOUNTER — Other Ambulatory Visit: Payer: Self-pay | Admitting: Primary Care

## 2018-10-23 DIAGNOSIS — Z0000 Encounter for general adult medical examination without abnormal findings: Secondary | ICD-10-CM

## 2018-10-31 ENCOUNTER — Other Ambulatory Visit: Payer: BC Managed Care – PPO

## 2018-11-06 ENCOUNTER — Encounter: Payer: BC Managed Care – PPO | Admitting: Primary Care

## 2018-11-18 ENCOUNTER — Other Ambulatory Visit: Payer: Self-pay

## 2018-11-18 ENCOUNTER — Other Ambulatory Visit (INDEPENDENT_AMBULATORY_CARE_PROVIDER_SITE_OTHER): Payer: No Typology Code available for payment source

## 2018-11-18 DIAGNOSIS — Z0000 Encounter for general adult medical examination without abnormal findings: Secondary | ICD-10-CM | POA: Diagnosis not present

## 2018-11-18 LAB — LIPID PANEL
Cholesterol: 156 mg/dL (ref 0–200)
HDL: 43.4 mg/dL
LDL Cholesterol: 86 mg/dL (ref 0–99)
NonHDL: 112.51
Total CHOL/HDL Ratio: 4
Triglycerides: 134 mg/dL (ref 0.0–149.0)
VLDL: 26.8 mg/dL (ref 0.0–40.0)

## 2018-11-18 LAB — COMPREHENSIVE METABOLIC PANEL WITH GFR
ALT: 14 U/L (ref 0–35)
AST: 14 U/L (ref 0–37)
Albumin: 4.2 g/dL (ref 3.5–5.2)
Alkaline Phosphatase: 78 U/L (ref 39–117)
BUN: 14 mg/dL (ref 6–23)
CO2: 23 meq/L (ref 19–32)
Calcium: 8.9 mg/dL (ref 8.4–10.5)
Chloride: 108 meq/L (ref 96–112)
Creatinine, Ser: 0.9 mg/dL (ref 0.40–1.20)
GFR: 74.96 mL/min
Glucose, Bld: 89 mg/dL (ref 70–99)
Potassium: 4.1 meq/L (ref 3.5–5.1)
Sodium: 138 meq/L (ref 135–145)
Total Bilirubin: 0.5 mg/dL (ref 0.2–1.2)
Total Protein: 7.5 g/dL (ref 6.0–8.3)

## 2018-11-21 ENCOUNTER — Other Ambulatory Visit: Payer: Self-pay

## 2018-11-21 ENCOUNTER — Ambulatory Visit (INDEPENDENT_AMBULATORY_CARE_PROVIDER_SITE_OTHER): Payer: No Typology Code available for payment source | Admitting: Primary Care

## 2018-11-21 VITALS — BP 104/72 | HR 72 | Temp 98.4°F | Ht 67.0 in | Wt 168.5 lb

## 2018-11-21 DIAGNOSIS — Z1283 Encounter for screening for malignant neoplasm of skin: Secondary | ICD-10-CM | POA: Diagnosis not present

## 2018-11-21 DIAGNOSIS — G43909 Migraine, unspecified, not intractable, without status migrainosus: Secondary | ICD-10-CM | POA: Diagnosis not present

## 2018-11-21 DIAGNOSIS — Z0000 Encounter for general adult medical examination without abnormal findings: Secondary | ICD-10-CM

## 2018-11-21 NOTE — Assessment & Plan Note (Signed)
Doing well on topiramate ER 50 mg, following with neurology and is doing well overall. Referral placed for insurance purposes. Continue same.

## 2018-11-21 NOTE — Patient Instructions (Signed)
You will be contacted regarding your referral to Dermatology.  Please let us know if you have not been contacted within one week.   Continue to work on a healthy diet.  Start exercising. You should be getting 150 minutes of moderate intensity exercise weekly.  Ensure you are consuming 64 ounces of water daily.  It was a pleasure to see you today!   Preventive Care 76-27 Years Old, Female Preventive care refers to visits with your health care provider and lifestyle choices that can promote health and wellness. This includes:  A yearly physical exam. This may also be called an annual well check.  Regular dental visits and eye exams.  Immunizations.  Screening for certain conditions.  Healthy lifestyle choices, such as eating a healthy diet, getting regular exercise, not using drugs or products that contain nicotine and tobacco, and limiting alcohol use. What can I expect for my preventive care visit? Physical exam Your health care provider will check your:  Height and weight. This may be used to calculate body mass index (BMI), which tells if you are at a healthy weight.  Heart rate and blood pressure.  Skin for abnormal spots. Counseling Your health care provider may ask you questions about your:  Alcohol, tobacco, and drug use.  Emotional well-being.  Home and relationship well-being.  Sexual activity.  Eating habits.  Work and work Statistician.  Method of birth control.  Menstrual cycle.  Pregnancy history. What immunizations do I need?  Influenza (flu) vaccine  This is recommended every year. Tetanus, diphtheria, and pertussis (Tdap) vaccine  You may need a Td booster every 10 years. Varicella (chickenpox) vaccine  You may need this if you have not been vaccinated. Human papillomavirus (HPV) vaccine  If recommended by your health care provider, you may need three doses over 6 months. Measles, mumps, and rubella (MMR) vaccine  You may need at least  one dose of MMR. You may also need a second dose. Meningococcal conjugate (MenACWY) vaccine  One dose is recommended if you are age 28-21 years and a first-year college student living in a residence hall, or if you have one of several medical conditions. You may also need additional booster doses. Pneumococcal conjugate (PCV13) vaccine  You may need this if you have certain conditions and were not previously vaccinated. Pneumococcal polysaccharide (PPSV23) vaccine  You may need one or two doses if you smoke cigarettes or if you have certain conditions. Hepatitis A vaccine  You may need this if you have certain conditions or if you travel or work in places where you may be exposed to hepatitis A. Hepatitis B vaccine  You may need this if you have certain conditions or if you travel or work in places where you may be exposed to hepatitis B. Haemophilus influenzae type b (Hib) vaccine  You may need this if you have certain conditions. You may receive vaccines as individual doses or as more than one vaccine together in one shot (combination vaccines). Talk with your health care provider about the risks and benefits of combination vaccines. What tests do I need?  Blood tests  Lipid and cholesterol levels. These may be checked every 5 years starting at age 28.  Hepatitis C test.  Hepatitis B test. Screening  Diabetes screening. This is done by checking your blood sugar (glucose) after you have not eaten for a while (fasting).  Sexually transmitted disease (STD) testing.  BRCA-related cancer screening. This may be done if you have a family history  of breast, ovarian, tubal, or peritoneal cancers.  Pelvic exam and Pap test. This may be done every 3 years starting at age 70. Starting at age 48, this may be done every 5 years if you have a Pap test in combination with an HPV test. Talk with your health care provider about your test results, treatment options, and if necessary, the need  for more tests. Follow these instructions at home: Eating and drinking   Eat a diet that includes fresh fruits and vegetables, whole grains, lean protein, and low-fat dairy.  Take vitamin and mineral supplements as recommended by your health care provider.  Do not drink alcohol if: ? Your health care provider tells you not to drink. ? You are pregnant, may be pregnant, or are planning to become pregnant.  If you drink alcohol: ? Limit how much you have to 0-1 drink a day. ? Be aware of how much alcohol is in your drink. In the [place] one drink equals one 12 oz bottle of beer (355 mL), one 5 oz glass of wine (148 mL), or one 1 oz glass of hard liquor (44 mL). Lifestyle  Take daily care of your teeth and gums.  Stay active. Exercise for at least 30 minutes on 5 or more days each week.  Do not use any products that contain nicotine or tobacco, such as cigarettes, e-cigarettes, and chewing tobacco. If you need help quitting, ask your health care provider.  If you are sexually active, practice safe sex. Use a condom or other form of birth control (contraception) in order to prevent pregnancy and STIs (sexually transmitted infections). If you plan to become pregnant, see your health care provider for a preconception visit. What's next?  Visit your health care provider once a year for a well check visit.  Ask your health care provider how often you should have your eyes and teeth checked.  Stay up to date on all vaccines. This information is not intended to replace advice given to you by your health care provider. Make sure you discuss any questions you have with your health care provider. Document Released: 05/15/2001 Document Revised: 11/28/2017 Document Reviewed: 11/28/2017 Elsevier Patient Education  2020 [employer].

## 2018-11-21 NOTE — Progress Notes (Signed)
Subjective:    Patient ID: Jessica Johnson, female    DOB: [birth year], 28 y.o.   MRN: [id number]  HPI  Ms. Jessica Johnson is a 28 year old female who presents today for complete physical.  Immunizations: -Tetanus: Completed in 2019 -Influenza: Due this season   Diet: She endorses a fair diet. She is eating low carb mostly (vegetables, lean protein). Desserts several days weekly. Drinking mostly water, coffee. Exercise: She is walking some, no regular exercise.  Eye exam: Completed several years ago. Had Lasik Dental exam: Completes semi-annually Pap Smear: Completed in 2019, negative  BP Readings from Last 3 Encounters:  11/21/18 104/72  03/11/18 108/68  02/18/18 104/70     Review of Systems  Constitutional: Negative for unexpected weight change.  HENT: Negative for rhinorrhea.   Respiratory: Negative for cough and shortness of breath.   Cardiovascular: Negative for chest pain.  Gastrointestinal: Negative for constipation and diarrhea.  Genitourinary: Negative for difficulty urinating.       Resumed OCP's 2 months ago, has had menstrual bleeding every other week since resuming.   Musculoskeletal: Negative for arthralgias and myalgias.  Skin: Negative for rash.       Numerous freckles and some moles, would like dermatology appointment. Family history of skin cancer.  Allergic/Immunologic: Negative for environmental allergies.  Neurological: Negative for dizziness and numbness.       Chronic headaches, improved with Topamax  Psychiatric/Behavioral: The patient is not nervous/anxious.        Past Medical History:  Diagnosis Date  . Migraine with aura   . PCOS (polycystic ovarian syndrome)      Social History   Socioeconomic History  . Marital status: Married    Spouse name: Not on file  . Number of children: Not on file  . Years of education: Not on file  . Highest education level: Not on file  Occupational History  . Not on file  Social Needs  . Financial resource  strain: Not on file  . Food insecurity    Worry: Not on file    Inability: Not on file  . Transportation needs    Medical: Not on file    Non-medical: Not on file  Tobacco Use  . Smoking status: Never Smoker  . Smokeless tobacco: Never Used  Substance and Sexual Activity  . Alcohol use: Yes    Alcohol/week: 1.0 standard drinks    Types: 1 Cans of beer per week    Comment: occasionally  . Drug use: No  . Sexual activity: Yes  Lifestyle  . Physical activity    Days per week: Not on file    Minutes per session: Not on file  . Stress: Not on file  Relationships  . Social Musicianconnections    Talks on phone: Not on file    Gets together: Not on file    Attends religious service: Not on file    Active member of club or organization: Not on file    Attends meetings of clubs or organizations: Not on file    Relationship status: Not on file  . Intimate partner violence    Fear of current or ex partner: Not on file    Emotionally abused: Not on file    Physically abused: Not on file    Forced sexual activity: Not on file  Other Topics Concern  . Not on file  Social History Narrative   Married.   1 child.   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  Past Surgical History:  Procedure Laterality Date  . CHOLECYSTECTOMY N/A 02/22/2014   Procedure: LAPAROSCOPIC CHOLECYSTECTOMY WITH INTRAOPERATIVE CHOLANGIOGRAM;  Surgeon: Autumn Messing III, MD;  Location: [facility];  Service: General;  Laterality: N/A;  . EYE SURGERY     lasik  2014    Family History  Problem Relation Age of Onset  . Skin cancer Mother   . Breast cancer Maternal Grandmother   . Skin cancer Maternal Grandmother   . Stomach cancer Paternal Grandfather     Allergies  Allergen Reactions  . Penicillins Hives and Other (See Comments)    Has patient had a PCN reaction causing immediate rash, facial/tongue/throat swelling, SOB or lightheadedness with hypotension: Unknown Has patient had a PCN reaction causing severe rash involving mucus  membranes or skin necrosis: Unknown Has patient had a PCN reaction that required hospitalization: Unknown Has patient had a PCN reaction occurring within the last 10 years: Unknown If all of the above answers are "NO", then may proceed with Cephalosporin use.  All medication that are Cillins     Current Outpatient Medications on File Prior to Visit  Medication Sig Dispense Refill  . norethindrone (MICRONOR,CAMILA,ERRIN) 0.35 MG tablet Take 1 tablet (0.35 mg total) by mouth daily. 1 Package 11  . topiramate ER (QUDEXY XR) 50 MG CS24 sprinkle capsule      No current facility-administered medications on file prior to visit.     BP 104/72   Pulse 72   Temp 98.4 F (36.9 C) (Oral)   Ht 5\' 7"  (1.702 m)   Wt 168 lb 8 oz (76.4 kg)   LMP 11/21/2018   SpO2 99%   BMI 26.39 kg/m    Objective:   Physical Exam  Constitutional: She is oriented to person, place, and time. She appears well-nourished.  HENT:  Right Ear: Tympanic membrane and ear canal normal.  Left Ear: Tympanic membrane and ear canal normal.  Mouth/Throat: Oropharynx is clear and moist.  Eyes: Pupils are equal, round, and reactive to light. EOM are normal.  Neck: Neck supple.  Cardiovascular: Normal rate and regular rhythm.  Respiratory: Effort normal and breath sounds normal.  GI: Soft. Bowel sounds are normal. There is no abdominal tenderness.  Musculoskeletal: Normal range of motion.  Neurological: She is alert and oriented to person, place, and time.  Skin: Skin is warm and dry.  Psychiatric: She has a normal mood and affect.           Assessment & Plan:

## 2018-11-21 NOTE — Assessment & Plan Note (Signed)
Immunizations UTD. Pap smear UTD. Encouraged regular exercise, healthy diet. Exam today benign. Labs reviewed. Follow up in 1 year for CPE.

## 2019-01-08 DIAGNOSIS — L814 Other melanin hyperpigmentation: Secondary | ICD-10-CM | POA: Diagnosis not present

## 2019-01-08 DIAGNOSIS — D1801 Hemangioma of skin and subcutaneous tissue: Secondary | ICD-10-CM | POA: Diagnosis not present

## 2019-01-08 DIAGNOSIS — D225 Melanocytic nevi of trunk: Secondary | ICD-10-CM | POA: Diagnosis not present

## 2019-03-24 ENCOUNTER — Other Ambulatory Visit: Payer: Self-pay | Admitting: Advanced Practice Midwife

## 2019-03-24 DIAGNOSIS — Z30011 Encounter for initial prescription of contraceptive pills: Secondary | ICD-10-CM

## 2019-03-24 NOTE — Telephone Encounter (Signed)
Pt overdue for annual exam 

## 2019-06-05 DIAGNOSIS — G43119 Migraine with aura, intractable, without status migrainosus: Secondary | ICD-10-CM | POA: Diagnosis not present

## 2019-10-15 ENCOUNTER — Other Ambulatory Visit: Payer: Self-pay | Admitting: Primary Care

## 2019-10-15 DIAGNOSIS — Z0000 Encounter for general adult medical examination without abnormal findings: Secondary | ICD-10-CM

## 2019-10-15 DIAGNOSIS — Z1159 Encounter for screening for other viral diseases: Secondary | ICD-10-CM

## 2019-11-24 ENCOUNTER — Other Ambulatory Visit: Payer: No Typology Code available for payment source

## 2019-11-27 ENCOUNTER — Encounter: Payer: No Typology Code available for payment source | Admitting: Primary Care

## 2019-12-10 ENCOUNTER — Other Ambulatory Visit (INDEPENDENT_AMBULATORY_CARE_PROVIDER_SITE_OTHER): Payer: BC Managed Care – PPO

## 2019-12-10 ENCOUNTER — Other Ambulatory Visit: Payer: Self-pay

## 2019-12-10 DIAGNOSIS — Z1159 Encounter for screening for other viral diseases: Secondary | ICD-10-CM

## 2019-12-10 DIAGNOSIS — Z0000 Encounter for general adult medical examination without abnormal findings: Secondary | ICD-10-CM

## 2019-12-10 LAB — CBC
HCT: 37.7 % (ref 36.0–46.0)
Hemoglobin: 12.3 g/dL (ref 12.0–15.0)
MCHC: 32.5 g/dL (ref 30.0–36.0)
MCV: 78.8 fl (ref 78.0–100.0)
Platelets: 308 10*3/uL (ref 150.0–400.0)
RBC: 4.78 Mil/uL (ref 3.87–5.11)
RDW: 14 % (ref 11.5–15.5)
WBC: 7.3 10*3/uL (ref 4.0–10.5)

## 2019-12-10 LAB — LIPID PANEL
Cholesterol: 178 mg/dL (ref 0–200)
HDL: 48.4 mg/dL (ref >39.00)
LDL Cholesterol: 106 mg/dL — ABNORMAL HIGH (ref 0–99)
NonHDL: 129.47
Total CHOL/HDL Ratio: 4
Triglycerides: 119 mg/dL (ref 0.0–149.0)
VLDL: 23.8 mg/dL (ref 0.0–40.0)

## 2019-12-10 LAB — COMPREHENSIVE METABOLIC PANEL
ALT: 12 U/L (ref 0–35)
AST: 15 U/L (ref 0–37)
Albumin: 4.2 g/dL (ref 3.5–5.2)
Alkaline Phosphatase: 58 U/L (ref 39–117)
BUN: 12 mg/dL (ref 6–23)
CO2: 26 mEq/L (ref 19–32)
Calcium: 9 mg/dL (ref 8.4–10.5)
Chloride: 105 mEq/L (ref 96–112)
Creatinine, Ser: 0.75 mg/dL (ref 0.40–1.20)
GFR: 91.8 mL/min (ref >60.00)
Glucose, Bld: 87 mg/dL (ref 70–99)
Potassium: 4.1 mEq/L (ref 3.5–5.1)
Sodium: 137 mEq/L (ref 135–145)
Total Bilirubin: 0.3 mg/dL (ref 0.2–1.2)
Total Protein: 7.4 g/dL (ref 6.0–8.3)

## 2019-12-11 LAB — HEPATITIS C ANTIBODY
Hepatitis C Ab: NONREACTIVE
SIGNAL TO CUT-OFF: 0.01 (ref <1.00)

## 2019-12-15 ENCOUNTER — Ambulatory Visit (INDEPENDENT_AMBULATORY_CARE_PROVIDER_SITE_OTHER): Payer: BC Managed Care – PPO | Admitting: Primary Care

## 2019-12-15 ENCOUNTER — Other Ambulatory Visit: Payer: Self-pay

## 2019-12-15 VITALS — BP 126/68 | HR 104 | Temp 98.6°F | Ht 67.0 in | Wt 182.0 lb

## 2019-12-15 DIAGNOSIS — K219 Gastro-esophageal reflux disease without esophagitis: Secondary | ICD-10-CM | POA: Diagnosis not present

## 2019-12-15 DIAGNOSIS — Z0000 Encounter for general adult medical examination without abnormal findings: Secondary | ICD-10-CM | POA: Diagnosis not present

## 2019-12-15 DIAGNOSIS — G43909 Migraine, unspecified, not intractable, without status migrainosus: Secondary | ICD-10-CM

## 2019-12-15 NOTE — Assessment & Plan Note (Signed)
Recently discontinued all medications for migraine prevention, following with Neurology.

## 2019-12-15 NOTE — Progress Notes (Signed)
Subjective:    Patient ID: Jessica Johnson, female    DOB: Jul 26, 1990, 29 y.o.   MRN: [id number]  HPI  This visit occurred during the SARS-CoV-2 public health emergency.  Safety protocols were in place, including screening questions prior to the visit, additional usage of staff PPE, and extensive cleaning of exam room while observing appropriate contact time as indicated for disinfecting solutions.   Ms. Geurts is a 29 year old female who presents today for complete physical.  Immunizations: -Tetanus: Completed in 2019 -Influenza: Due this season  -Covid-19: Completed first dose  Diet: She endorses a fair diet.  Exercise: She is walking a lot at work, no regular exercise.   Eye exam: No recent visit since 2014 Dental exam: Completes semi-annually   Pap Smear: Completed in 2019, follows with [facility]  BP Readings from Last 3 Encounters:  12/15/19 126/68  11/21/18 104/72  03/11/18 108/68     Review of Systems  Constitutional: Negative for unexpected weight change.  HENT: Negative for rhinorrhea.   Respiratory: Negative for cough and shortness of breath.   Cardiovascular: Negative for chest pain.  Gastrointestinal: Negative for constipation and diarrhea.  Genitourinary: Negative for difficulty urinating and menstrual problem.       History of PCOS and regular cycles recently  Musculoskeletal: Negative for arthralgias and myalgias.  Skin: Negative for rash.  Allergic/Immunologic: Negative for environmental allergies.  Neurological: Positive for headaches. Negative for dizziness and numbness.  Psychiatric/Behavioral: The patient is not nervous/anxious.        Past Medical History:  Diagnosis Date  . Migraine with aura   . PCOS (polycystic ovarian syndrome)      Social History   Socioeconomic History  . Marital status: Married    Spouse name: Not on file  . Number of children: Not on file  . Years of education: Not on file  . Highest education level: Not on file    Occupational History  . Not on file  Tobacco Use  . Smoking status: Never Smoker  . Smokeless tobacco: Never Used  Vaping Use  . Vaping Use: Never used  Substance and Sexual Activity  . Alcohol use: Yes    Alcohol/week: 1.0 standard drink    Types: 1 Cans of beer per week    Comment: occasionally  . Drug use: No  . Sexual activity: Yes  Other Topics Concern  . Not on file  Social History Narrative   Married.   1 child.   Works as a Engineer, civil (consulting) for [employer].   Social Determinants of Health   Financial Resource Strain:   . Difficulty of Paying Living Expenses: Not on file  Food Insecurity:   . Worried About Programme researcher, broadcasting/film/video in the Last Year: Not on file  . Ran Out of Food in the Last Year: Not on file  Transportation Needs:   . Lack of Transportation (Medical): Not on file  . Lack of Transportation (Non-Medical): Not on file  Physical Activity:   . Days of Exercise per Week: Not on file  . Minutes of Exercise per Session: Not on file  Stress:   . Feeling of Stress : Not on file  Social Connections:   . Frequency of Communication with Friends and Family: Not on file  . Frequency of Social Gatherings with Friends and Family: Not on file  . Attends Religious Services: Not on file  . Active Member of Clubs or Organizations: Not on file  . Attends Banker  Meetings: Not on file  . Marital Status: Not on file  Intimate Partner Violence:   . Fear of Current or Ex-Partner: Not on file  . Emotionally Abused: Not on file  . Physically Abused: Not on file  . Sexually Abused: Not on file    Past Surgical History:  Procedure Laterality Date  . CHOLECYSTECTOMY N/A 02/22/2014   Procedure: LAPAROSCOPIC CHOLECYSTECTOMY WITH INTRAOPERATIVE CHOLANGIOGRAM;  Surgeon: Chevis Pretty III, MD;  Location: [facility];  Service: General;  Laterality: N/A;  . EYE SURGERY     lasik  2014    Family History  Problem Relation Age of Onset  . Skin cancer Mother   . Breast cancer Maternal  Grandmother   . Skin cancer Maternal Grandmother   . Stomach cancer Paternal Grandfather     Allergies  Allergen Reactions  . Penicillins Hives and Other (See Comments)    Has patient had a PCN reaction causing immediate rash, facial/tongue/throat swelling, SOB or lightheadedness with hypotension: Unknown Has patient had a PCN reaction causing severe rash involving mucus membranes or skin necrosis: Unknown Has patient had a PCN reaction that required hospitalization: Unknown Has patient had a PCN reaction occurring within the last 10 years: Unknown If all of the above answers are "NO", then may proceed with Cephalosporin use.  All medication that are Cillins     No current outpatient medications on file prior to visit.   No current facility-administered medications on file prior to visit.    BP 126/68   Pulse (!) 104   Temp 98.6 F (37 C) (Temporal)   Ht 5\' 7"  (1.702 m)   Wt 182 lb (82.6 kg)   SpO2 95%   BMI 28.51 kg/m    Objective:   Physical Exam HENT:     Right Ear: Tympanic membrane and ear canal normal.     Left Ear: Tympanic membrane and ear canal normal.  Eyes:     Pupils: Pupils are equal, round, and reactive to light.  Cardiovascular:     Rate and Rhythm: Normal rate and regular rhythm.  Pulmonary:     Effort: Pulmonary effort is normal.     Breath sounds: Normal breath sounds.  Abdominal:     General: Bowel sounds are normal.     Palpations: Abdomen is soft.     Tenderness: There is no abdominal tenderness.  Musculoskeletal:        General: Normal range of motion.     Cervical back: Neck supple.  Skin:    General: Skin is warm and dry.  Neurological:     Mental Status: She is alert and oriented to person, place, and time.     Cranial Nerves: No cranial nerve deficit.     Deep Tendon Reflexes:     Reflex Scores:      Patellar reflexes are 2+ on the right side and 2+ on the left side. Psychiatric:        Mood and Affect: Mood normal.             Assessment & Plan:

## 2019-12-15 NOTE — Assessment & Plan Note (Signed)
Immunizations UTD, will get flu shot at work. Pap smear UTD. Discussed the importance of a healthy diet and regular exercise in order for weight loss, and to reduce the risk of any potential medical problems.  Exam today unremarkable. Labs reviewed.

## 2019-12-15 NOTE — Patient Instructions (Signed)
Start exercising. You should be getting 150 minutes of moderate intensity exercise weekly.  It's important to improve your diet by reducing consumption of fast food, fried food, processed snack foods, sugary drinks. Increase consumption of fresh vegetables and fruits, whole grains, water.  Ensure you are drinking 64 ounces of water daily.  It was a pleasure to see you today!   Preventive Care 21-28 Years Old, Female Preventive care refers to visits with your health care provider and lifestyle choices that can promote health and wellness. This includes:  A yearly physical exam. This may also be called an annual well check.  Regular dental visits and eye exams.  Immunizations.  Screening for certain conditions.  Healthy lifestyle choices, such as eating a healthy diet, getting regular exercise, not using drugs or products that contain nicotine and tobacco, and limiting alcohol use. What can I expect for my preventive care visit? Physical exam Your health care provider will check your:  Height and weight. This may be used to calculate body mass index (BMI), which tells if you are at a healthy weight.  Heart rate and blood pressure.  Skin for abnormal spots. Counseling Your health care provider may ask you questions about your:  Alcohol, tobacco, and drug use.  Emotional well-being.  Home and relationship well-being.  Sexual activity.  Eating habits.  Work and work environment.  Method of birth control.  Menstrual cycle.  Pregnancy history. What immunizations do I need?  Influenza (flu) vaccine  This is recommended every year. Tetanus, diphtheria, and pertussis (Tdap) vaccine  You may need a Td booster every 10 years. Varicella (chickenpox) vaccine  You may need this if you have not been vaccinated. Human papillomavirus (HPV) vaccine  If recommended by your health care provider, you may need three doses over 6 months. Measles, mumps, and rubella (MMR)  vaccine  You may need at least one dose of MMR. You may also need a second dose. Meningococcal conjugate (MenACWY) vaccine  One dose is recommended if you are age 19-21 years and a first-year college student living in a residence hall, or if you have one of several medical conditions. You may also need additional booster doses. Pneumococcal conjugate (PCV13) vaccine  You may need this if you have certain conditions and were not previously vaccinated. Pneumococcal polysaccharide (PPSV23) vaccine  You may need one or two doses if you smoke cigarettes or if you have certain conditions. Hepatitis A vaccine  You may need this if you have certain conditions or if you travel or work in places where you may be exposed to hepatitis A. Hepatitis B vaccine  You may need this if you have certain conditions or if you travel or work in places where you may be exposed to hepatitis B. Haemophilus influenzae type b (Hib) vaccine  You may need this if you have certain conditions. You may receive vaccines as individual doses or as more than one vaccine together in one shot (combination vaccines). Talk with your health care provider about the risks and benefits of combination vaccines. What tests do I need?  Blood tests  Lipid and cholesterol levels. These may be checked every 5 years starting at age 20.  Hepatitis C test.  Hepatitis B test. Screening  Diabetes screening. This is done by checking your blood sugar (glucose) after you have not eaten for a while (fasting).  Sexually transmitted disease (STD) testing.  BRCA-related cancer screening. This may be done if you have a family history of breast, ovarian,   tubal, or peritoneal cancers.  Pelvic exam and Pap test. This may be done every 3 years starting at age 21. Starting at age 30, this may be done every 5 years if you have a Pap test in combination with an HPV test. Talk with your health care provider about your test results, treatment  options, and if necessary, the need for more tests. Follow these instructions at home: Eating and drinking   Eat a diet that includes fresh fruits and vegetables, whole grains, lean protein, and low-fat dairy.  Take vitamin and mineral supplements as recommended by your health care provider.  Do not drink alcohol if: ? Your health care provider tells you not to drink. ? You are pregnant, may be pregnant, or are planning to become pregnant.  If you drink alcohol: ? Limit how much you have to 0-1 drink a day. ? Be aware of how much alcohol is in your drink. In the [place] one drink equals one 12 oz bottle of beer (355 mL), one 5 oz glass of wine (148 mL), or one 1 oz glass of hard liquor (44 mL). Lifestyle  Take daily care of your teeth and gums.  Stay active. Exercise for at least 30 minutes on 5 or more days each week.  Do not use any products that contain nicotine or tobacco, such as cigarettes, e-cigarettes, and chewing tobacco. If you need help quitting, ask your health care provider.  If you are sexually active, practice safe sex. Use a condom or other form of birth control (contraception) in order to prevent pregnancy and STIs (sexually transmitted infections). If you plan to become pregnant, see your health care provider for a preconception visit. What's next?  Visit your health care provider once a year for a well check visit.  Ask your health care provider how often you should have your eyes and teeth checked.  Stay up to date on all vaccines. This information is not intended to replace advice given to you by your health care provider. Make sure you discuss any questions you have with your health care provider. Document Revised: 11/28/2017 Document Reviewed: 11/28/2017 Elsevier Patient Education  2020 Elsevier Inc.  

## 2019-12-15 NOTE — Assessment & Plan Note (Signed)
Denies concerns for GERD.

## 2020-01-04 ENCOUNTER — Ambulatory Visit: Payer: BC Managed Care – PPO | Attending: Internal Medicine

## 2020-01-04 ENCOUNTER — Other Ambulatory Visit (HOSPITAL_BASED_OUTPATIENT_CLINIC_OR_DEPARTMENT_OTHER): Payer: Self-pay | Admitting: Internal Medicine

## 2020-01-04 DIAGNOSIS — Z23 Encounter for immunization: Secondary | ICD-10-CM

## 2020-01-11 MED FILL — PFIZER-BIONTECH COVID-19 VA: 30 | 1 days supply | Qty: 0 | Fill #0
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
## 2020-07-27 ENCOUNTER — Telehealth: Payer: BC Managed Care – PPO | Admitting: Physician Assistant

## 2020-07-27 ENCOUNTER — Other Ambulatory Visit (HOSPITAL_BASED_OUTPATIENT_CLINIC_OR_DEPARTMENT_OTHER): Payer: Self-pay

## 2020-07-27 DIAGNOSIS — J208 Acute bronchitis due to other specified organisms: Secondary | ICD-10-CM

## 2020-07-27 DIAGNOSIS — B9689 Other specified bacterial agents as the cause of diseases classified elsewhere: Secondary | ICD-10-CM

## 2020-07-27 MED ORDER — BENZONATATE 200 MG PO CAPS
200.0000 mg | ORAL_CAPSULE | Freq: Three times a day (TID) | ORAL | 0 refills | Status: DC | PRN
  Filled 2020-07-27: qty 30, 10d supply, fill #0

## 2020-07-27 MED ORDER — PREDNISONE 10 MG PO TABS
ORAL_TABLET | ORAL | 0 refills | Status: DC
  Filled 2020-07-27: qty 21, 6d supply, fill #0

## 2020-07-27 MED ORDER — AZITHROMYCIN 250 MG PO TABS
ORAL_TABLET | ORAL | 0 refills | Status: DC
  Filled 2020-07-27: qty 6, 5d supply, fill #0

## 2020-07-27 NOTE — Progress Notes (Signed)
We are sorry that you are not feeling well.  Here is how we plan to help!  Based on your presentation I believe you most likely have A cough due to bacteria.  When patients have a fever and a productive cough with a change in color or increased sputum production, we are concerned about bacterial bronchitis.  If left untreated it can progress to pneumonia.  If your symptoms do not improve with your treatment plan it is important that you contact your provider.   I have prescribed Azithromyin 250 mg: two tablets now and then one tablet daily for 4 additonal days    In addition you may use A prescription cough medication called Tessalon Perles 100mg. You may take 1-2 capsules every 8 hours as needed for your cough.  Prednisone 10 mg daily for 6 days (see taper instructions below)  Directions for 6 day taper: Day 1: 2 tablets before breakfast, 1 after both lunch & dinner and 2 at bedtime Day 2: 1 tab before breakfast, 1 after both lunch & dinner and 2 at bedtime Day 3: 1 tab at each meal & 1 at bedtime Day 4: 1 tab at breakfast, 1 at lunch, 1 at bedtime Day 5: 1 tab at breakfast & 1 tab at bedtime Day 6: 1 tab at breakfast   From your responses in the eVisit questionnaire you describe inflammation in the upper respiratory tract which is causing a significant cough.  This is commonly called Bronchitis and has four common causes:    Allergies  Viral Infections  Acid Reflux  Bacterial Infection Allergies, viruses and acid reflux are treated by controlling symptoms or eliminating the cause. An example might be a cough caused by taking certain blood pressure medications. You stop the cough by changing the medication. Another example might be a cough caused by acid reflux. Controlling the reflux helps control the cough.  USE OF BRONCHODILATOR ("RESCUE") INHALERS: There is a risk from using your bronchodilator too frequently.  The risk is that over-reliance on a medication which only relaxes the  muscles surrounding the breathing tubes can reduce the effectiveness of medications prescribed to reduce swelling and congestion of the tubes themselves.  Although you feel brief relief from the bronchodilator inhaler, your asthma may actually be worsening with the tubes becoming more swollen and filled with mucus.  This can delay other crucial treatments, such as oral steroid medications. If you need to use a bronchodilator inhaler daily, several times per day, you should discuss this with your provider.  There are probably better treatments that could be used to keep your asthma under control.     HOME CARE . Only take medications as instructed by your medical team. . Complete the entire course of an antibiotic. . Drink plenty of fluids and get plenty of rest. . Avoid close contacts especially the very young and the elderly . Cover your mouth if you cough or cough into your sleeve. . Always remember to wash your hands . A steam or ultrasonic humidifier can help congestion.   GET HELP RIGHT AWAY IF: . You develop worsening fever. . You become short of breath . You cough up blood. . Your symptoms persist after you have completed your treatment plan MAKE SURE YOU   Understand these instructions.  Will watch your condition.  Will get help right away if you are not doing well or get worse.  Your e-visit answers were reviewed by a board certified advanced clinical practitioner to complete your   personal care plan.  Depending on the condition, your plan could have included both over the counter or prescription medications. If there is a problem please reply  once you have received a response from your provider. Your safety is important to us.  If you have drug allergies check your prescription carefully.    You can use MyChart to ask questions about today's visit, request a non-urgent call back, or ask for a work or school excuse for 24 hours related to this e-Visit. If it has been greater than  24 hours you will need to follow up with your provider, or enter a new e-Visit to address those concerns. You will get an e-mail in the next two days asking about your experience.  I hope that your e-visit has been valuable and will speed your recovery. Thank you for using e-visits.  I provided 5 minutes of non face-to-face time during this encounter for chart review and documentation.  

## 2020-10-17 ENCOUNTER — Other Ambulatory Visit (HOSPITAL_BASED_OUTPATIENT_CLINIC_OR_DEPARTMENT_OTHER): Payer: Self-pay

## 2020-10-17 ENCOUNTER — Telehealth: Payer: BC Managed Care – PPO | Admitting: Physician Assistant

## 2020-10-17 ENCOUNTER — Encounter: Payer: Self-pay | Admitting: Physician Assistant

## 2020-10-17 DIAGNOSIS — R058 Other specified cough: Secondary | ICD-10-CM

## 2020-10-17 MED ORDER — AZITHROMYCIN 250 MG PO TABS
ORAL_TABLET | ORAL | 0 refills | Status: DC
  Filled 2020-10-17: qty 6, 5d supply, fill #0

## 2020-10-17 MED ORDER — ALBUTEROL SULFATE HFA 108 (90 BASE) MCG/ACT IN AERS
2.0000 | INHALATION_SPRAY | Freq: Four times a day (QID) | RESPIRATORY_TRACT | 0 refills | Status: DC | PRN
  Filled 2020-10-17: qty 8.5, 25d supply, fill #0

## 2020-10-17 MED ORDER — BENZONATATE 100 MG PO CAPS
100.0000 mg | ORAL_CAPSULE | Freq: Three times a day (TID) | ORAL | 0 refills | Status: DC | PRN
  Filled 2020-10-17: qty 30, 10d supply, fill #0

## 2020-10-17 MED ORDER — PREDNISONE 10 MG PO TABS
ORAL_TABLET | ORAL | 0 refills | Status: DC
  Filled 2020-10-17: qty 21, 6d supply, fill #0

## 2020-10-17 NOTE — Progress Notes (Signed)
Jessica Johnson are scheduled for a virtual visit with your provider today.    Just as we do with appointments in the office, we must obtain your consent to participate.  Your consent will be active for this visit and any virtual visit you may have with one of our providers in the next 365 days.    If you have a MyChart account, I can also send a copy of this consent to you electronically.  All virtual visits are billed to your insurance company just like a traditional visit in the office.  As this is a virtual visit, video technology does not allow for your provider to perform a traditional examination.  This may limit your provider's ability to fully assess your condition.  If your provider identifies any concerns that need to be evaluated in person or the need to arrange testing such as labs, EKG, etc, we will make arrangements to do so.    Although advances in technology are sophisticated, we cannot ensure that it will always work on either your end or our end.  If the connection with a video visit is poor, we may have to switch to a telephone visit.  With either a video or telephone visit, we are not always able to ensure that we have a secure connection.   I need to obtain your verbal consent now.   Are you willing to proceed with your visit today?   Jessica Johnson has provided verbal consent on 10/17/2020 for a virtual visit (video or telephone).   Jessica Loveless, PA-C 10/17/2020  12:13 PM  Virtual Visit Consent   Jessica Johnson, you are scheduled for a virtual visit with a [place] provider today.     Just as with appointments in the office, your consent must be obtained to participate.  Your consent will be active for this visit and any virtual visit you may have with one of our providers in the next 365 days.     If you have a MyChart account, a copy of this consent can be sent to you electronically.  All virtual visits are billed to your insurance company just like a traditional visit  in the office.    As this is a virtual visit, video technology does not allow for your provider to perform a traditional examination.  This may limit your provider's ability to fully assess your condition.  If your provider identifies any concerns that need to be evaluated in person or the need to arrange testing (such as labs, EKG, etc.), we will make arrangements to do so.     Although advances in technology are sophisticated, we cannot ensure that it will always work on either your end or our end.  If the connection with a video visit is poor, the visit may have to be switched to a telephone visit.  With either a video or telephone visit, we are not always able to ensure that we have a secure connection.     I need to obtain your verbal consent now.   Are you willing to proceed with your visit today?    Jessica Johnson has provided verbal consent on 10/17/2020 for a virtual visit (video or telephone).   Jessica Loveless, PA-C   Date: 10/17/2020 12:13 PM   Virtual Visit via Video Note   IMargaretann Johnson, connected with  Jessica Johnson  ([id number], Dec 03, 1990) on 10/17/20 at 11:30 AM EDT by a video-enabled telemedicine application and verified that I am  speaking with the correct person using two identifiers.  Location: Patient: Work; Games developer: Engineer, mining Provider: Home Office   I discussed the limitations of evaluation and management by telemedicine and the availability of in person appointments. The patient expressed understanding and agreed to proceed.    History of Present Illness: Jessica Johnson is a 30 y.o. who identifies as a female who was assigned female at birth, and is being seen today for cough and SOB following Covid 19.   HPI: Cough This is a new problem. The current episode started 1 to 4 weeks ago (had Covid 09/27/20 and cough started after that without improvement). The problem has been gradually worsening. The cough is Non-productive. Associated  symptoms include chest pain and wheezing. The symptoms are aggravated by lying down and exercise. Risk factors: covid 19. She has tried OTC cough suppressant and rest for the symptoms. Her past medical history is significant for bronchitis.   Problems:  Patient Active Problem List   Diagnosis Date Noted   Preventative health care 11/21/2018   Migraines 02/18/2018   GERD (gastroesophageal reflux disease) 02/18/2018    Allergies:  Allergies  Allergen Reactions   Penicillins Hives and Other (See Comments)    Has patient had a PCN reaction causing immediate rash, facial/tongue/throat swelling, SOB or lightheadedness with hypotension: Unknown Has patient had a PCN reaction causing severe rash involving mucus membranes or skin necrosis: Unknown Has patient had a PCN reaction that required hospitalization: Unknown Has patient had a PCN reaction occurring within the last 10 years: Unknown If all of the above answers are "NO", then may proceed with Cephalosporin use.  All medication that are Cillins    Medications:  Current Outpatient Medications:    albuterol (VENTOLIN HFA) 108 (90 Base) MCG/ACT inhaler, Inhale 2 puffs by mouth into the lungs every 6 (six) hours as needed for wheezing or shortness of breath., Disp: 8.5 g, Rfl: 0   azithromycin (ZITHROMAX) 250 MG tablet, Take 2 tablets PO on day one, and one tablet PO daily thereafter until completed., Disp: 6 tablet, Rfl: 0   benzonatate (TESSALON) 100 MG capsule, Take 1 capsule (100 mg total) by mouth 3 (three) times daily as needed., Disp: 30 capsule, Rfl: 0   predniSONE (STERAPRED UNI-PAK 21 TAB) 10 MG (21) TBPK tablet, 6 day taper; take as directed on package instructions, Disp: 21 tablet, Rfl: 0   COVID-19 mRNA vaccine, Pfizer, 30 MCG/0.3ML injection, INJECT AS DIRECTED, Disp: .3 mL, Rfl: 0  Observations/Objective: Patient is well-developed, well-nourished in no acute distress.  Resting comfortably at work Head is normocephalic,  atraumatic.  No labored breathing. Dry, barky, infrequent cough heard Speech is clear and coherent with logical content.  Patient is alert and oriented at baseline.   Assessment and Plan: 1. Post-viral cough syndrome - predniSONE (STERAPRED UNI-PAK 21 TAB) 10 MG (21) TBPK tablet; 6 day taper; take as directed on package instructions  Dispense: 21 tablet; Refill: 0 - albuterol (VENTOLIN HFA) 108 (90 Base) MCG/ACT inhaler; Inhale 2 puffs by mouth into the lungs every 6 (six) hours as needed for wheezing or shortness of breath.  Dispense: 8.5 g; Refill: 0 - benzonatate (TESSALON) 100 MG capsule; Take 1 capsule (100 mg total) by mouth 3 (three) times daily as needed.  Dispense: 30 capsule; Refill: 0 - azithromycin (ZITHROMAX) 250 MG tablet; Take 2 tablets PO on day one, and one tablet PO daily thereafter until completed.  Dispense: 6 tablet; Refill: 0  - Suspect post viral  cough syndrome secondary to recent Covid 19 infection, but will cover for atypical secondary pneumonia as well with Zpak - Prednisone and albuterol for chest tightness, SOB, and wheezing - Tessalon perles for cough PRN - Push fluids - Rest as needed - Seek in person evaluation if not improving or if symptoms continue to worsen.   Follow Up Instructions: I discussed the assessment and treatment plan with the patient. The patient was provided an opportunity to ask questions and all were answered. The patient agreed with the plan and demonstrated an understanding of the instructions.  A copy of instructions were sent to the patient via MyChart.  The patient was advised to call back or seek an in-person evaluation if the symptoms worsen or if the condition fails to improve as anticipated.  Time:  I spent 12 minutes with the patient via telehealth technology discussing the above problems/concerns.    Jessica Loveless, PA-C

## 2020-10-17 NOTE — Patient Instructions (Signed)
Jessica Johnson, thank you for joining [name], PA-C for today's virtual visit.  While this provider is not your primary care provider (PCP), if your PCP is located in our provider database this encounter information will be shared with them immediately following your visit.  Consent: (Patient) Jessica Johnson provided verbal consent for this virtual visit at the beginning of the encounter.  Current Medications:  Current Outpatient Medications:    albuterol (VENTOLIN HFA) 108 (90 Base) MCG/ACT inhaler, Inhale 2 puffs by mouth into the lungs every 6 (six) hours as needed for wheezing or shortness of breath., Disp: 8.5 g, Rfl: 0   azithromycin (ZITHROMAX) 250 MG tablet, Take 2 tablets by mouth on day one, and then one tablet by mouth daily thereafter until completed., Disp: 6 tablet, Rfl: 0   benzonatate (TESSALON) 100 MG capsule, Take 1 capsule (100 mg total) by mouth 3 (three) times daily as needed., Disp: 30 capsule, Rfl: 0   predniSONE (STERAPRED UNI-PAK 21 TAB) 10 MG (21) TBPK tablet, 6 day taper; take as directed on package instructions, Disp: 21 tablet, Rfl: 0   COVID-19 mRNA vaccine, Pfizer, 30 MCG/0.3ML injection, INJECT AS DIRECTED, Disp: .3 mL, Rfl: 0   Medications ordered in this encounter:  Meds ordered this encounter  Medications   predniSONE (STERAPRED UNI-PAK 21 TAB) 10 MG (21) TBPK tablet    Sig: 6 day taper; take as directed on package instructions    Dispense:  21 tablet    Refill:  0    Order Specific Question:   Supervising Provider    Answer:   Hyacinth Meeker, BRIAN [[id number]]   albuterol (VENTOLIN HFA) 108 (90 Base) MCG/ACT inhaler    Sig: Inhale 2 puffs by mouth into the lungs every 6 (six) hours as needed for wheezing or shortness of breath.    Dispense:  8.5 g    Refill:  0    Order Specific Question:   Supervising Provider    Answer:   MILLER, BRIAN [[id number]]   benzonatate (TESSALON) 100 MG capsule    Sig: Take 1 capsule (100 mg total) by mouth 3 (three) times daily as  needed.    Dispense:  30 capsule    Refill:  0    Order Specific Question:   Supervising Provider    Answer:   Hyacinth Meeker, BRIAN [[id number]]   azithromycin (ZITHROMAX) 250 MG tablet    Sig: Take 2 tablets by mouth on day one, and then one tablet by mouth daily thereafter until completed.    Dispense:  6 tablet    Refill:  0    Order Specific Question:   Supervising Provider    Answer:   Hyacinth Meeker, BRIAN [[id number]]     *If you need refills on other medications prior to your next appointment, please contact your pharmacy*  Follow-Up: Call back or seek an in-person evaluation if the symptoms worsen or if the condition fails to improve as anticipated   If you have been instructed to have an in-person evaluation today at a local Urgent Care facility, please use the link below. It will take you to a list of all of our available [facility], including address, phone number and hours of operation. Please do not delay care.  [facility]  If you or a family member do not have a primary care provider, use the link below to schedule a visit and establish care. When you choose a [facility] primary care physician or advanced practice provider,  you gain a long-term partner in health. Find a Primary Care Provider  Learn more about [facility] in-office and virtual care options: [facility] - Get Care Now

## 2020-10-19 ENCOUNTER — Telehealth: Payer: BC Managed Care – PPO | Admitting: Family Medicine

## 2020-12-16 ENCOUNTER — Other Ambulatory Visit: Payer: Self-pay

## 2020-12-16 ENCOUNTER — Other Ambulatory Visit (HOSPITAL_BASED_OUTPATIENT_CLINIC_OR_DEPARTMENT_OTHER): Payer: Self-pay

## 2020-12-16 ENCOUNTER — Encounter: Payer: Self-pay | Admitting: Primary Care

## 2020-12-16 ENCOUNTER — Ambulatory Visit (INDEPENDENT_AMBULATORY_CARE_PROVIDER_SITE_OTHER): Payer: BC Managed Care – PPO | Admitting: Primary Care

## 2020-12-16 VITALS — BP 108/80 | HR 71 | Temp 98.0°F | Ht 67.0 in | Wt 186.0 lb

## 2020-12-16 DIAGNOSIS — Z0000 Encounter for general adult medical examination without abnormal findings: Secondary | ICD-10-CM

## 2020-12-16 DIAGNOSIS — G43909 Migraine, unspecified, not intractable, without status migrainosus: Secondary | ICD-10-CM

## 2020-12-16 DIAGNOSIS — K219 Gastro-esophageal reflux disease without esophagitis: Secondary | ICD-10-CM

## 2020-12-16 DIAGNOSIS — E559 Vitamin D deficiency, unspecified: Secondary | ICD-10-CM | POA: Diagnosis not present

## 2020-12-16 DIAGNOSIS — E785 Hyperlipidemia, unspecified: Secondary | ICD-10-CM | POA: Diagnosis not present

## 2020-12-16 LAB — COMPREHENSIVE METABOLIC PANEL
AG Ratio: 1.3 (calc) (ref 1.0–2.5)
ALT: 13 U/L (ref 6–29)
AST: 17 U/L (ref 10–30)
Albumin: 4.4 g/dL (ref 3.6–5.1)
Alkaline phosphatase (APISO): 61 U/L (ref 31–125)
BUN: 11 mg/dL (ref 7–25)
CO2: 23 mmol/L (ref 20–32)
Calcium: 9.3 mg/dL (ref 8.6–10.2)
Chloride: 103 mmol/L (ref 98–110)
Creat: 0.8 mg/dL (ref 0.50–0.96)
Globulin: 3.3 g/dL (calc) (ref 1.9–3.7)
Glucose, Bld: 79 mg/dL (ref 65–99)
Potassium: 4.1 mmol/L (ref 3.5–5.3)
Sodium: 138 mmol/L (ref 135–146)
Total Bilirubin: 0.4 mg/dL (ref 0.2–1.2)
Total Protein: 7.7 g/dL (ref 6.1–8.1)

## 2020-12-16 LAB — LIPID PANEL
Cholesterol: 212 mg/dL — ABNORMAL HIGH (ref <200)
HDL: 57 mg/dL (ref >=50)
LDL Cholesterol (Calc): 134 mg/dL (calc) — ABNORMAL HIGH
Non-HDL Cholesterol (Calc): 155 mg/dL (calc) — ABNORMAL HIGH (ref <130)
Total CHOL/HDL Ratio: 3.7 (calc) (ref <5.0)
Triglycerides: 105 mg/dL (ref <150)

## 2020-12-16 LAB — CBC
HCT: 41.8 % (ref 35.0–45.0)
Hemoglobin: 13.4 g/dL (ref 11.7–15.5)
MCH: 24.6 pg — ABNORMAL LOW (ref 27.0–33.0)
MCHC: 32.1 g/dL (ref 32.0–36.0)
MCV: 76.7 fL — ABNORMAL LOW (ref 80.0–100.0)
MPV: 10.9 fL (ref 7.5–12.5)
Platelets: 310 10*3/uL (ref 140–400)
RBC: 5.45 10*6/uL — ABNORMAL HIGH (ref 3.80–5.10)
RDW: 14.2 % (ref 11.0–15.0)
WBC: 7.5 10*3/uL (ref 3.8–10.8)

## 2020-12-16 LAB — VITAMIN D 25 HYDROXY (VIT D DEFICIENCY, FRACTURES): Vit D, 25-Hydroxy: 25 ng/mL — ABNORMAL LOW (ref 30–100)

## 2020-12-16 LAB — TSH: TSH: 1.56 mIU/L

## 2020-12-16 MED ORDER — TOPIRAMATE 50 MG PO TABS
50.0000 mg | ORAL_TABLET | Freq: Every day | ORAL | 3 refills | Status: DC
  Filled 2020-12-16: qty 30, 30d supply, fill #0
  Filled 2021-02-07 – 2021-02-20 (×2): qty 30, 30d supply, fill #1

## 2020-12-16 NOTE — Assessment & Plan Note (Signed)
No concerns today, continue to monitor.  Not on treatment.

## 2020-12-16 NOTE — Progress Notes (Signed)
Subjective:    Patient ID: Jessica Johnson, female    DOB: 22-Jun-1990, 30 y.o.   MRN: [id number]  HPI  Jessica Johnson is a very pleasant 30 y.o. female who presents today for complete physical and follow up of chronic conditions.  Migraines occurring 2-3 times monthly, improved from previously, but still bothersome. No longer on Topamax 50 mg as she doesn't want to return to her neurologist. Less migraines on Topamax.   Immunizations: -Tetanus: 2019 -Influenza: Declines  -Covid-19: 2 vaccines   Diet: Fair diet.  Exercise: No regular exercise.  Eye exam: Completes annually  Dental exam: Completes semi-annually   Pap Smear: Completed 2019  BP Readings from Last 3 Encounters:  12/16/20 108/80  12/15/19 126/68  11/21/18 104/72   Wt Readings from Last 3 Encounters:  12/16/20 186 lb (84.4 kg)  12/15/19 182 lb (82.6 kg)  11/21/18 168 lb 8 oz (76.4 kg)     Review of Systems  Constitutional:  Negative for unexpected weight change.  HENT:  Negative for rhinorrhea.   Eyes:  Negative for visual disturbance.  Respiratory:  Negative for cough and shortness of breath.   Cardiovascular:  Negative for chest pain.  Gastrointestinal:  Negative for constipation and diarrhea.  Genitourinary:  Negative for difficulty urinating and menstrual problem.  Musculoskeletal:  Negative for arthralgias and myalgias.  Skin:  Negative for rash.  Allergic/Immunologic: Negative for environmental allergies.  Neurological:  Positive for headaches. Negative for dizziness.  Psychiatric/Behavioral:  The patient is not nervous/anxious.         Past Medical History:  Diagnosis Date   Migraine with aura    PCOS (polycystic ovarian syndrome)     Social History   Socioeconomic History   Marital status: Married    Spouse name: Not on file   Number of children: Not on file   Years of education: Not on file   Highest education level: Not on file  Occupational History   Not on file  Tobacco Use    Smoking status: Never   Smokeless tobacco: Never  Vaping Use   Vaping Use: Never used  Substance and Sexual Activity   Alcohol use: Yes    Alcohol/week: 1.0 standard drink    Types: 1 Cans of beer per week    Comment: occasionally   Drug use: No   Sexual activity: Yes  Other Topics Concern   Not on file  Social History Narrative   Married.   1 child.   Works as a Engineer, civil (consulting) for [employer].   Social Determinants of Health   Financial Resource Strain: Not on file  Food Insecurity: Not on file  Transportation Needs: Not on file  Physical Activity: Not on file  Stress: Not on file  Social Connections: Not on file  Intimate Partner Violence: Not on file    Past Surgical History:  Procedure Laterality Date   CHOLECYSTECTOMY N/A 02/22/2014   Procedure: LAPAROSCOPIC CHOLECYSTECTOMY WITH INTRAOPERATIVE CHOLANGIOGRAM;  Surgeon: Chevis Pretty III, MD;  Location: [facility] OR;  Service: General;  Laterality: N/A;   EYE SURGERY     lasik  2014    Family History  Problem Relation Age of Onset   Skin cancer Mother    Breast cancer Maternal Grandmother    Skin cancer Maternal Grandmother    Stomach cancer Paternal Grandfather     Allergies  Allergen Reactions   Penicillins Hives and Other (See Comments)    Has patient had a PCN reaction causing immediate rash, facial/tongue/throat swelling,  SOB or lightheadedness with hypotension: Unknown Has patient had a PCN reaction causing severe rash involving mucus membranes or skin necrosis: Unknown Has patient had a PCN reaction that required hospitalization: Unknown Has patient had a PCN reaction occurring within the last 10 years: Unknown If all of the above answers are "NO", then may proceed with Cephalosporin use.  All medication that are Cillins     No current outpatient medications on file prior to visit.   No current facility-administered medications on file prior to visit.    BP 108/80 (BP Location: Left Arm, Patient Position: Sitting, Cuff  Size: Normal)   Pulse 71   Temp 98 F (36.7 C) (Temporal)   Ht 5\' 7"  (1.702 m)   Wt 186 lb (84.4 kg)   SpO2 99%   BMI 29.13 kg/m  Objective:   Physical Exam HENT:     Right Ear: Tympanic membrane and ear canal normal.     Left Ear: Tympanic membrane and ear canal normal.     Nose: Nose normal.  Eyes:     Conjunctiva/sclera: Conjunctivae normal.     Pupils: Pupils are equal, round, and reactive to light.  Neck:     Thyroid: No thyromegaly.  Cardiovascular:     Rate and Rhythm: Normal rate and regular rhythm.     Heart sounds: No murmur heard. Pulmonary:     Effort: Pulmonary effort is normal.     Breath sounds: Normal breath sounds. No rales.  Abdominal:     General: Bowel sounds are normal.     Palpations: Abdomen is soft.     Tenderness: There is no abdominal tenderness.  Musculoskeletal:        General: Normal range of motion.     Cervical back: Neck supple.  Lymphadenopathy:     Cervical: No cervical adenopathy.  Skin:    General: Skin is warm and dry.     Findings: No rash.  Neurological:     Mental Status: She is alert and oriented to person, place, and time.     Cranial Nerves: No cranial nerve deficit.     Deep Tendon Reflexes: Reflexes are normal and symmetric.  Psychiatric:        Mood and Affect: Mood normal.          Assessment & Plan:      This visit occurred during the SARS-CoV-2 public health emergency.  Safety protocols were in place, including screening questions prior to the visit, additional usage of staff PPE, and extensive cleaning of exam room while observing appropriate contact time as indicated for disinfecting solutions.

## 2020-12-16 NOTE — Assessment & Plan Note (Signed)
Occurring too frequently. Will resume Topamax 50 mg HS, patient agrees.  She will use Tylenol or Ibuprofen for abortive treatment as his has historically helped.

## 2020-12-16 NOTE — Assessment & Plan Note (Signed)
Will get influenza vaccine at work. Tetanus UTD.  Pap smear due, I offered today, she will call GYN.  Discussed the importance of a healthy diet and regular exercise in order for weight loss, and to reduce the risk of further co-morbidity.  Exam today stable. Labs pending

## 2020-12-16 NOTE — Patient Instructions (Addendum)
Stop by the lab prior to leaving today. I will notify you of your results once received.   Resume Topamax 50 mg at bedtime for migraine prevention.   It was a pleasure to see you today!  Preventive Care 53-29 Years Old, Female Preventive care refers to lifestyle choices and visits with your health care provider that can promote health and wellness. This includes: A yearly physical exam. This is also called an annual wellness visit. Regular dental and eye exams. Immunizations. Screening for certain conditions. Healthy lifestyle choices, such as: Eating a healthy diet. Getting regular exercise. Not using drugs or products that contain nicotine and tobacco. Limiting alcohol use. What can I expect for my preventive care visit? Physical exam Your health care provider may check your: Height and weight. These may be used to calculate your BMI (body mass index). BMI is a measurement that tells if you are at a healthy weight. Heart rate and blood pressure. Body temperature. Skin for abnormal spots. Counseling Your health care provider may ask you questions about your: Past medical problems. Family's medical history. Alcohol, tobacco, and drug use. Emotional well-being. Home life and relationship well-being. Sexual activity. Diet, exercise, and sleep habits. Work and work Statistician. Access to firearms. Method of birth control. Menstrual cycle. Pregnancy history. What immunizations do I need? Vaccines are usually given at various ages, according to a schedule. Your health care provider will recommend vaccines for you based on your age, medical history, and lifestyle or other factors, such as travel or where you work. What tests do I need? Blood tests Lipid and cholesterol levels. These may be checked every 5 years starting at age 68. Hepatitis C test. Hepatitis B test. Screening Diabetes screening. This is done by checking your blood sugar (glucose) after you have not eaten for a  while (fasting). STD (sexually transmitted disease) testing, if you are at risk. BRCA-related cancer screening. This may be done if you have a family history of breast, ovarian, tubal, or peritoneal cancers. Pelvic exam and Pap test. This may be done every 3 years starting at age 4. Starting at age 58, this may be done every 5 years if you have a Pap test in combination with an HPV test. Talk with your health care provider about your test results, treatment options, and if necessary, the need for more tests. Follow these instructions at home: Eating and drinking  Eat a healthy diet that includes fresh fruits and vegetables, whole grains, lean protein, and low-fat dairy products. Take vitamin and mineral supplements as recommended by your health care provider. Do not drink alcohol if: Your health care provider tells you not to drink. You are pregnant, may be pregnant, or are planning to become pregnant. If you drink alcohol: Limit how much you have to 0-1 drink a day. Be aware of how much alcohol is in your drink. In the [place] one drink equals one 12 oz bottle of beer (355 mL), one 5 oz glass of wine (148 mL), or one 1 oz glass of hard liquor (44 mL). Lifestyle Take daily care of your teeth and gums. Brush your teeth every morning and night with fluoride toothpaste. Floss one time each day. Stay active. Exercise for at least 30 minutes 5 or more days each week. Do not use any products that contain nicotine or tobacco, such as cigarettes, e-cigarettes, and chewing tobacco. If you need help quitting, ask your health care provider. Do not use drugs. If you are sexually active, practice safe sex.  Use a condom or other form of protection to prevent STIs (sexually transmitted infections). If you do not wish to become pregnant, use a form of birth control. If you plan to become pregnant, see your health care provider for a prepregnancy visit. Find healthy ways to cope with stress, such  as: Meditation, yoga, or listening to music. Journaling. Talking to a trusted person. Spending time with friends and family. Safety Always wear your seat belt while driving or riding in a vehicle. Do not drive: If you have been drinking alcohol. Do not ride with someone who has been drinking. When you are tired or distracted. While texting. Wear a helmet and other protective equipment during sports activities. If you have firearms in your house, make sure you follow all gun safety procedures. Seek help if you have been physically or sexually abused. What's next? Go to your health care provider once a year for an annual wellness visit. Ask your health care provider how often you should have your eyes and teeth checked. Stay up to date on all vaccines. This information is not intended to replace advice given to you by your health care provider. Make sure you discuss any questions you have with your health care provider. Document Revised: 05/27/2020 Document Reviewed: 11/28/2017 Elsevier Patient Education  2022 [employer].

## 2021-02-07 ENCOUNTER — Other Ambulatory Visit (HOSPITAL_BASED_OUTPATIENT_CLINIC_OR_DEPARTMENT_OTHER): Payer: Self-pay

## 2021-02-17 ENCOUNTER — Other Ambulatory Visit (HOSPITAL_BASED_OUTPATIENT_CLINIC_OR_DEPARTMENT_OTHER): Payer: Self-pay

## 2021-02-20 ENCOUNTER — Other Ambulatory Visit (HOSPITAL_BASED_OUTPATIENT_CLINIC_OR_DEPARTMENT_OTHER): Payer: Self-pay

## 2021-03-01 DIAGNOSIS — H43391 Other vitreous opacities, right eye: Secondary | ICD-10-CM | POA: Diagnosis not present

## 2021-09-05 ENCOUNTER — Other Ambulatory Visit (HOSPITAL_BASED_OUTPATIENT_CLINIC_OR_DEPARTMENT_OTHER): Payer: Self-pay

## 2021-09-05 ENCOUNTER — Telehealth: Payer: BC Managed Care – PPO | Admitting: Physician Assistant

## 2021-09-05 DIAGNOSIS — H60332 Swimmer's ear, left ear: Secondary | ICD-10-CM

## 2021-09-05 MED ORDER — NEOMYCIN-POLYMYXIN-HC 3.5-10000-1 OT SOLN
3.0000 [drp] | Freq: Four times a day (QID) | OTIC | 0 refills | Status: DC
  Filled 2021-09-05: qty 10, 17d supply, fill #0

## 2021-09-05 NOTE — Progress Notes (Signed)
I have spent 5 minutes in review of e-visit questionnaire, review and updating patient chart, medical decision making and response to patient.   Derric Dealmeida Cody Romy Mcgue, PA-C    

## 2021-09-05 NOTE — Progress Notes (Signed)
E Visit for Swimmer's Ear  We are sorry that you are not feeling well. Here is how we plan to help!  Based on what you have shared with me it looks like you have swimmers ear. Swimmer's ear is a redness or swelling, irritation, or infection of your outer ear canal.  These symptoms usually occur within a few days of swimming.  Your ear canal is a tube that goes from the opening of the ear to the eardrum.  When water stays in your ear canal, germs can grow.  This is a painful condition that often happens to children and swimmers of all ages.  It is not contagious and oral antibiotics are not required to treat uncomplicated swimmer's ear.  The usual symptoms include: Itching inside the ear, Redness or a sense of swelling in the ear, Pain when the ear is tugged on when pressure is placed on the ear, Pus draining from the infected ear. and I have prescribed: Neomycin 0.35%, polymyxin B 10,000 units/mL, and hydrocortisone 0,5% otic solution 4 drops in affected ears four times a day for 7 days  In certain cases swimmer's ear may progress to a more serious bacterial infection of the middle or inner ear.  If you have a fever 102 and up and significantly worsening symptoms, this could indicate a more serious infection moving to the middle/inner and needs face to face evaluation in an office by a provider.  Your symptoms should improve over the next 3 days and should resolve in about 7 days.  HOME CARE:  Wash your hands frequently. Do not place the tip of the bottle on your ear or touch it with your fingers. You can take Acetominophen 650 mg every 4-6 hours as needed for pain.  If pain is severe or moderate, you can apply a heating pad (set on low) or hot water bottle (wrapped in a towel) to outer ear for 20 minutes.  This will also increase drainage. Avoid ear plugs Do not use Q-tips After showers, help the water run out by tilting your head to one side.  GET HELP RIGHT AWAY IF:  Fever is over 102.2  degrees. You develop progressive ear pain or hearing loss. Ear symptoms persist longer than 3 days after treatment.  MAKE SURE YOU:  Understand these instructions. Will watch your condition. Will get help right away if you are not doing well or get worse.  TO PREVENT SWIMMER'S EAR: Use a bathing cap or custom fitted swim molds to keep your ears dry. Towel off after swimming to dry your ears. Tilt your head or pull your earlobes to allow the water to escape your ear canal. If there is still water in your ears, consider using a hairdryer on the lowest setting.   Thank you for choosing an e-visit.  Your e-visit answers were reviewed by a board certified advanced clinical practitioner to complete your personal care plan. Depending upon the condition, your plan could have included both over the counter or prescription medications.  Please review your pharmacy choice. Make sure the pharmacy is open so you can pick up prescription now. If there is a problem, you may contact your provider through MyChart messaging and have the prescription routed to another pharmacy.  Your safety is important to us. If you have drug allergies check your prescription carefully.   For the next 24 hours you can use MyChart to ask questions about today's visit, request a non-urgent call back, or ask for a work or   school excuse. You will get an email in the next two days asking about your experience. I hope that your e-visit has been valuable and will speed your recovery.    

## 2021-09-25 ENCOUNTER — Encounter: Payer: Self-pay | Admitting: Obstetrics & Gynecology

## 2021-09-25 ENCOUNTER — Other Ambulatory Visit (HOSPITAL_COMMUNITY)
Admission: RE | Admit: 2021-09-25 | Discharge: 2021-09-25 | Disposition: A | Discharge status: Home / Self Care | Payer: BC Managed Care – PPO | Source: Ambulatory Visit | Attending: Advanced Practice Midwife | Admitting: Advanced Practice Midwife

## 2021-09-25 ENCOUNTER — Ambulatory Visit: Payer: BC Managed Care – PPO | Admitting: Obstetrics & Gynecology

## 2021-09-25 VITALS — BP 120/80 | Ht 67.0 in | Wt 189.0 lb

## 2021-09-25 DIAGNOSIS — N921 Excessive and frequent menstruation with irregular cycle: Secondary | ICD-10-CM | POA: Insufficient documentation

## 2021-09-26 LAB — CERVICOVAGINAL ANCILLARY ONLY
Bacterial Vaginitis (gardnerella): NEGATIVE
Candida Glabrata: NEGATIVE
Candida Vaginitis: NEGATIVE
Chlamydia: NEGATIVE
Comment: NEGATIVE
Comment: NEGATIVE
Comment: NEGATIVE
Comment: NEGATIVE
Comment: NEGATIVE
Comment: NORMAL
Neisseria Gonorrhea: NEGATIVE
Trichomonas: NEGATIVE

## 2021-09-26 LAB — TSH+FREE T4
Free T4: 1.1 ng/dL (ref 0.82–1.77)
TSH: 1.18 u[IU]/mL (ref 0.450–4.500)

## 2021-09-29 ENCOUNTER — Ambulatory Visit: Payer: BC Managed Care – PPO | Admitting: Advanced Practice Midwife

## 2021-10-17 ENCOUNTER — Other Ambulatory Visit (HOSPITAL_COMMUNITY)
Admission: RE | Admit: 2021-10-17 | Discharge: 2021-10-17 | Disposition: A | Discharge status: Home / Self Care | Payer: BC Managed Care – PPO | Source: Ambulatory Visit | Attending: Obstetrics & Gynecology | Admitting: Obstetrics & Gynecology

## 2021-10-17 ENCOUNTER — Encounter: Payer: Self-pay | Admitting: Obstetrics & Gynecology

## 2021-10-17 ENCOUNTER — Ambulatory Visit (INDEPENDENT_AMBULATORY_CARE_PROVIDER_SITE_OTHER): Payer: BC Managed Care – PPO | Admitting: Obstetrics & Gynecology

## 2021-10-17 VITALS — BP 120/78 | Ht 67.0 in | Wt 190.0 lb

## 2021-10-17 DIAGNOSIS — Z124 Encounter for screening for malignant neoplasm of cervix: Secondary | ICD-10-CM | POA: Insufficient documentation

## 2021-10-17 DIAGNOSIS — Z01419 Encounter for gynecological examination (general) (routine) without abnormal findings: Secondary | ICD-10-CM | POA: Diagnosis not present

## 2021-10-17 DIAGNOSIS — Z803 Family history of malignant neoplasm of breast: Secondary | ICD-10-CM | POA: Diagnosis not present

## 2021-10-17 DIAGNOSIS — Z808 Family history of malignant neoplasm of other organs or systems: Secondary | ICD-10-CM

## 2021-10-17 NOTE — Progress Notes (Signed)
Subjective:    Jessica Johnson is a 31 y.o. married P1 (31 yo)  who presents for an annual exam. The patient has no complaints today. She thinks that she is getting ready to start her period- feels crampy. The patient is sexually active. She is not using contraception- is taking PNVs.  GYN screening history: last pap: was normal. The patient wears seatbelts: yes. The patient participates in regular exercise: yes. Has the patient ever been transfused or tattooed?: yes. The patient reports that there is not domestic violence in her life.   Menstrual History: OB History     Gravida  2   Para  1   Term  1   Preterm      AB  1   Living  1      SAB  1   IAB      Ectopic      Multiple  0   Live Births  1           Menarche age:  Patient's last menstrual period was 08/31/2021 (exact date). Period Pattern: (!) Irregular  The following portions of the patient's history were reviewed and updated as appropriate: allergies, current medications, past family history, past medical history, past social history, past surgical history, and problem list.  Review of Systems Pertinent items are noted in HPI.  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 She has been monogamous since 2007. Her pregnancy required IVF. She denies  a h/o abnormal pap smears, had the Gardasil series in the past. In her maternal side of the family, there is breast, cervical and melanoma x 2. Her father tested negative for genetic abnormalities due to abnormalities in his siblings.   Objective:    BP 120/78   Ht $R'5\' 7"'BT$  (1.702 m)   Wt 190 lb (86.2 kg)   LMP 08/31/2021 (Exact Date)   BMI 29.76 kg/m   General Appearance:    Alert, cooperative, no distress, appears stated age  Head:    Normocephalic, without obvious abnormality, atraumatic  Eyes:    PERRL, conjunctiva/corneas clear, EOM's intact, fundi    benign, both eyes  Ears:    Normal TM's and external ear canals, both ears  Nose:   Nares  normal, septum midline, mucosa normal, no drainage    or sinus tenderness  Throat:   Lips, mucosa, and tongue normal; teeth and gums normal  Neck:   Supple, symmetrical, trachea midline, no adenopathy;    thyroid:  no enlargement/tenderness/nodules; no carotid   bruit or JVD  Back:     Symmetric, no curvature, ROM normal, no CVA tenderness  Lungs:     Clear to auscultation bilaterally, respirations unlabored  Chest Wall:    No tenderness or deformity   Heart:    Regular rate and rhythm, S1 and S2 normal, no murmur, rub   or gallop  Breast Exam:    No tenderness, masses, or nipple abnormality  Abdomen:     Soft, non-tender, bowel sounds active all four quadrants,    no masses, no organomegaly  Genitalia:     Normal female without lesion, discharge or tenderness, normal size and shape, anteverted, normal adnexal exam   Rectal:    Deferred  Extremities:   Extremities normal, atraumatic, no cyanosis or edema  Pulses:   2+ and symmetric all extremities  Skin:   Skin color, texture, turgor normal, no rashes or lesions  Lymph nodes:   Cervical, supraclavicular, and axillary  nodes normal  Neurologic:   CNII-XII intact, normal strength, sensation and reflexes    throughout  .    Assessment:    Healthy female exam.     Plan:     Await pap smear results.  I offered to give her cyclic provera if she doesn't start her period. We discussed genetic testing due to the [place] on her mom's side. I have ordered My[facility], but the personnel who do the blood draw for that test aren't here today, so she can schedule a nurse visit to have it drawn. She will be getting fasting labs and exam with her primary care in the near future.

## 2021-10-19 LAB — CYTOLOGY - PAP
Comment: NEGATIVE
High risk HPV: NEGATIVE

## 2021-10-21 ENCOUNTER — Encounter: Payer: Self-pay | Admitting: Obstetrics & Gynecology

## 2021-10-26 ENCOUNTER — Ambulatory Visit: Payer: BC Managed Care – PPO

## 2021-10-26 DIAGNOSIS — Z808 Family history of malignant neoplasm of other organs or systems: Secondary | ICD-10-CM | POA: Diagnosis not present

## 2021-10-26 DIAGNOSIS — Z803 Family history of malignant neoplasm of breast: Secondary | ICD-10-CM | POA: Diagnosis not present

## 2021-10-26 DIAGNOSIS — Z8049 Family history of malignant neoplasm of other genital organs: Secondary | ICD-10-CM | POA: Diagnosis not present

## 2021-10-26 DIAGNOSIS — Z800 Family history of malignant neoplasm of digestive organs: Secondary | ICD-10-CM | POA: Diagnosis not present

## 2021-10-26 NOTE — Progress Notes (Signed)
Pt came to do myriad paperwork and blood work.

## 2021-10-31 DIAGNOSIS — Z1379 Encounter for other screening for genetic and chromosomal anomalies: Secondary | ICD-10-CM

## 2021-10-31 HISTORY — DX: Encounter for other screening for genetic and chromosomal anomalies: Z13.79

## 2021-11-14 ENCOUNTER — Encounter: Payer: Self-pay | Admitting: Obstetrics & Gynecology

## 2021-11-14 ENCOUNTER — Ambulatory Visit (INDEPENDENT_AMBULATORY_CARE_PROVIDER_SITE_OTHER): Payer: BC Managed Care – PPO | Admitting: Obstetrics & Gynecology

## 2021-11-14 ENCOUNTER — Other Ambulatory Visit (HOSPITAL_COMMUNITY)
Admission: RE | Admit: 2021-11-14 | Discharge: 2021-11-14 | Disposition: A | Discharge status: Home / Self Care | Payer: BC Managed Care – PPO | Source: Ambulatory Visit | Attending: Obstetrics & Gynecology | Admitting: Obstetrics & Gynecology

## 2021-11-14 VITALS — BP 120/80 | HR 84 | Ht 67.0 in | Wt 187.0 lb

## 2021-11-14 DIAGNOSIS — R87612 Low grade squamous intraepithelial lesion on cytologic smear of cervix (LGSIL): Secondary | ICD-10-CM | POA: Insufficient documentation

## 2021-11-14 NOTE — Progress Notes (Signed)
   Subjective:    Patient ID: Jessica Johnson, female    DOB: 1990/12/20, 31 y.o.   MRN: [id number]  HPI 31 yo married P90 (30 y.o. daughter) here for a colpo due to LGSIL pap last month. She had the Gardasil series and denies a h/o abnormal paps.   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 UPT negative, consent signed, time out done Cervix prepped with acetic acid. Transformation zone seen in its entirety. Colpo adequate. Changes c/w LGSIL seen in a circumferencial fashion at the os (acetowhite changes) I obtained a biopsy at the 12 and 6 o'clock positions. Silver nitrate was used to achieve hemostasis. ECC obtained. She tolerated the procedure well.       Assessment & Plan:

## 2021-11-15 LAB — SURGICAL PATHOLOGY

## 2021-11-18 ENCOUNTER — Encounter: Payer: Self-pay | Admitting: Obstetrics & Gynecology

## 2021-11-21 ENCOUNTER — Telehealth: Payer: Self-pay | Admitting: Obstetrics and Gynecology

## 2021-11-23 NOTE — Telephone Encounter (Signed)
No further documentation needed

## 2021-12-14 ENCOUNTER — Encounter: Payer: Self-pay | Admitting: Obstetrics and Gynecology

## 2021-12-19 ENCOUNTER — Encounter: Payer: Self-pay | Admitting: Obstetrics & Gynecology

## 2022-07-30 ENCOUNTER — Telehealth: Payer: Self-pay | Admitting: Primary Care

## 2022-07-30 NOTE — Telephone Encounter (Signed)
Pt would like to know if possible to change PCP from Vernona Rieger to Ria Clock at [facility], if ok with providers. Please advise. Pt tel [phone]

## 2022-07-30 NOTE — Telephone Encounter (Signed)
Fine with me, thanks. 

## 2022-08-01 DIAGNOSIS — H5213 Myopia, bilateral: Secondary | ICD-10-CM | POA: Diagnosis not present

## 2022-08-23 ENCOUNTER — Ambulatory Visit (INDEPENDENT_AMBULATORY_CARE_PROVIDER_SITE_OTHER): Payer: BC Managed Care – PPO | Admitting: Family

## 2022-08-23 ENCOUNTER — Encounter: Payer: Self-pay | Admitting: Family

## 2022-08-23 VITALS — BP 118/68 | HR 94 | Ht 67.0 in | Wt 187.2 lb

## 2022-08-23 DIAGNOSIS — E559 Vitamin D deficiency, unspecified: Secondary | ICD-10-CM | POA: Diagnosis not present

## 2022-08-23 DIAGNOSIS — N926 Irregular menstruation, unspecified: Secondary | ICD-10-CM

## 2022-08-23 DIAGNOSIS — Z8742 Personal history of other diseases of the female genital tract: Secondary | ICD-10-CM

## 2022-08-23 NOTE — Progress Notes (Signed)
Jessica Johnson is a 32 y.o. female with the following history as recorded in EpicCare:  Patient Active Problem List   Diagnosis Date Noted   Preventative health care 11/21/2018   Migraines 02/18/2018   GERD (gastroesophageal reflux disease) 02/18/2018    Current Outpatient Medications  Medication Sig Dispense Refill   BERBERINE CHLORIDE PO Take 1,200 mg by mouth.     Prenatal Vit-Fe Fumarate-FA (PRENATAL MULTIVITAMIN) TABS tablet Take 1 tablet by mouth daily at 12 noon.     INOSITOL-D CHIRO-INOSITOL PO Take by mouth. (Patient not taking: Reported on 08/23/2022)     neomycin-polymyxin-hydrocortisone (CORTISPORIN) OTIC solution Place 3 drops into the left ear 4 (four) times daily. (Patient not taking: Reported on 08/23/2022) 10 mL 0   topiramate (TOPAMAX) 50 MG tablet Take 1 tablet (50 mg total) by mouth at bedtime for headache prevention. (Patient not taking: Reported on 08/23/2022) 90 tablet 3   No current facility-administered medications for this visit.    Allergies: Penicillins  Past Medical History:  Diagnosis Date   Family history of genetic disorder    CDH1 on pat side; pt is CDH1 neg   Genetic testing 10/2021   [facility] CDH1 NEG   Migraine with aura    PCOS (polycystic ovarian syndrome)     Past Surgical History:  Procedure Laterality Date   CHOLECYSTECTOMY N/A 02/22/2014   Procedure: LAPAROSCOPIC CHOLECYSTECTOMY WITH INTRAOPERATIVE CHOLANGIOGRAM;  Surgeon: Chevis Pretty III, MD;  Location: [facility] OR;  Service: General;  Laterality: N/A;   EYE SURGERY     lasik  2014    Family History  Problem Relation Age of Onset   Skin cancer Mother    Cervical cancer Mother 50   Breast cancer Maternal Grandmother        56s   Skin cancer Maternal Grandmother    Skin cancer Maternal Grandfather    Stomach cancer Paternal Grandfather        41s   Gastric cancer Paternal Grandfather    Other Paternal Aunt        pos CDH1 gene   Other Paternal Uncle        pos CDH1 gene   Other Cousin         pos CDH1 gene   Other Cousin        pos CDH1 gene    Social History   Tobacco Use   Smoking status: Never   Smokeless tobacco: Never  Substance Use Topics   Alcohol use: Yes    Alcohol/week: 1.0 standard drink of alcohol    Types: 1 Cans of beer per week    Comment: occasionally    Subjective:   History of PCOS; transferring from [facility] in [place]; concerned about irregular bleeding/ painful periods; is planning to establish with new GYN; wants to get "hormones right and take care of my health."  LMP 07/30/22; required IVF with first pregnancy      Objective:  Vitals:   08/23/22 1319  BP: 118/68  Pulse: 94  SpO2: 98%  Weight: 187 lb 3.2 oz (84.9 kg)  Height: 5\' 7"  (1.702 m)    General: Well developed, well nourished, in no acute distress  Skin : Warm and dry.  Head: Normocephalic and atraumatic  Eyes: Sclera and conjunctiva clear; pupils round and reactive to light; extraocular movements intact  Ears: External normal; canals clear; tympanic membranes normal  Oropharynx: Pink, supple. No suspicious lesions  Neck: Supple without thyromegaly, adenopathy  Lungs: Respirations unlabored; clear to auscultation bilaterally  without wheeze, rales, rhonchi  CVS exam: normal rate and regular rhythm.  Abdomen: Soft; nontender; nondistended; normoactive bowel sounds; no masses or hepatosplenomegaly  Musculoskeletal: No deformities; no active joint inflammation  Extremities: No edema, cyanosis, clubbing  Vessels: Symmetric bilaterally  Neurologic: Alert and oriented; speech intact; face symmetrical; moves all extremities well; CNII-XII intact without focal deficit   Assessment:  1. Vitamin D deficiency   2. Irregular periods/menstrual cycles   3. Hx of abnormal cervical Pap smear     Plan:  Check Vitamin D level today; follow up to be determined; &3. Update pelvic ultrasound; will update labs today; patient is currently taking Berberine to help manage her symptoms and does  feel getting some relief; to consider trial of Metformin and/or GLP-1; she is planning to establish with new GYN- would like to go to [facility]; follow up to be determined based on labs;   No follow-ups on file.  Orders Placed This Encounter  Procedures   US Pelvic Complete With Transvaginal    Standing Status:   Future    Standing Expiration Date:   08/23/2023    Order Specific Question:   Reason for Exam (SYMPTOM  OR DIAGNOSIS REQUIRED)    Answer:   irregular periods    Order Specific Question:   Preferred imaging location?    Answer:   [facility]   Vitamin D (25 hydroxy)   CBC with Differential/Platelet   Comp Met (CMET)   TSH   Hemoglobin A1c   FSH   LH   Prolactin   Ambulatory referral to Obstetrics / Gynecology    Referral Priority:   Routine    Referral Type:   Consultation    Referral Reason:   Specialty Services Required    Requested Specialty:   Obstetrics and Gynecology    Number of Visits Requested:   1    Requested Prescriptions    No prescriptions requested or ordered in this encounter

## 2022-08-24 ENCOUNTER — Other Ambulatory Visit: Payer: Self-pay | Admitting: Family

## 2022-08-24 ENCOUNTER — Other Ambulatory Visit (HOSPITAL_BASED_OUTPATIENT_CLINIC_OR_DEPARTMENT_OTHER): Payer: Self-pay

## 2022-08-24 LAB — CBC WITH DIFFERENTIAL/PLATELET
Basophils Absolute: 0.1 10*3/uL (ref 0.0–0.1)
Basophils Relative: 0.6 % (ref 0.0–3.0)
Eosinophils Absolute: 0.1 10*3/uL (ref 0.0–0.7)
Eosinophils Relative: 1.5 % (ref 0.0–5.0)
HCT: 39.7 % (ref 36.0–46.0)
Hemoglobin: 13 g/dL (ref 12.0–15.0)
Lymphocytes Relative: 30.7 % (ref 12.0–46.0)
Lymphs Abs: 2.5 10*3/uL (ref 0.7–4.0)
MCHC: 32.6 g/dL (ref 30.0–36.0)
MCV: 77.7 fl — ABNORMAL LOW (ref 78.0–100.0)
Monocytes Absolute: 0.4 10*3/uL (ref 0.1–1.0)
Monocytes Relative: 4.9 % (ref 3.0–12.0)
Neutro Abs: 5.1 10*3/uL (ref 1.4–7.7)
Neutrophils Relative %: 62.3 % (ref 43.0–77.0)
Platelets: 284 10*3/uL (ref 150.0–400.0)
RBC: 5.11 Mil/uL (ref 3.87–5.11)
RDW: 13.8 % (ref 11.5–15.5)
WBC: 8.2 10*3/uL (ref 4.0–10.5)

## 2022-08-24 LAB — COMPREHENSIVE METABOLIC PANEL
ALT: 11 U/L (ref 0–35)
AST: 16 U/L (ref 0–37)
Albumin: 4.2 g/dL (ref 3.5–5.2)
Alkaline Phosphatase: 51 U/L (ref 39–117)
BUN: 14 mg/dL (ref 6–23)
CO2: 27 mEq/L (ref 19–32)
Calcium: 9.1 mg/dL (ref 8.4–10.5)
Chloride: 102 mEq/L (ref 96–112)
Creatinine, Ser: 0.81 mg/dL (ref 0.40–1.20)
GFR: 96.99 mL/min (ref >60.00)
Glucose, Bld: 89 mg/dL (ref 70–99)
Potassium: 3.9 mEq/L (ref 3.5–5.1)
Sodium: 138 mEq/L (ref 135–145)
Total Bilirubin: 0.2 mg/dL (ref 0.2–1.2)
Total Protein: 7.2 g/dL (ref 6.0–8.3)

## 2022-08-24 LAB — LUTEINIZING HORMONE: LH: 12.76 m[IU]/mL

## 2022-08-24 LAB — FOLLICLE STIMULATING HORMONE: FSH: 4.4 m[IU]/mL

## 2022-08-24 LAB — HEMOGLOBIN A1C: Hgb A1c MFr Bld: 5.6 % (ref 4.6–6.5)

## 2022-08-24 LAB — TSH: TSH: 1.23 u[IU]/mL (ref 0.35–5.50)

## 2022-08-24 LAB — PROLACTIN: Prolactin: 10.4 ng/mL

## 2022-08-24 LAB — VITAMIN D 25 HYDROXY (VIT D DEFICIENCY, FRACTURES): VITD: 23.81 ng/mL — ABNORMAL LOW (ref 30.00–100.00)

## 2022-08-24 MED ORDER — VITAMIN D (ERGOCALCIFEROL) 1.25 MG (50000 UNIT) PO CAPS
50000.0000 [IU] | ORAL_CAPSULE | ORAL | 0 refills | Status: AC
  Filled 2022-08-24: qty 4, 28d supply, fill #0
  Filled 2022-09-25: qty 4, 28d supply, fill #1
  Filled 2022-10-24: qty 4, 28d supply, fill #2

## 2022-08-30 ENCOUNTER — Ambulatory Visit (HOSPITAL_BASED_OUTPATIENT_CLINIC_OR_DEPARTMENT_OTHER)
Admission: RE | Admit: 2022-08-30 | Discharge: 2022-08-30 | Disposition: A | Discharge status: Home / Self Care | Payer: BC Managed Care – PPO | Source: Ambulatory Visit | Attending: Family | Admitting: Family

## 2022-08-30 ENCOUNTER — Ambulatory Visit (HOSPITAL_BASED_OUTPATIENT_CLINIC_OR_DEPARTMENT_OTHER): Payer: BC Managed Care – PPO

## 2022-08-30 DIAGNOSIS — N926 Irregular menstruation, unspecified: Secondary | ICD-10-CM | POA: Insufficient documentation

## 2022-08-30 DIAGNOSIS — N920 Excessive and frequent menstruation with regular cycle: Secondary | ICD-10-CM | POA: Diagnosis not present

## 2022-09-05 ENCOUNTER — Encounter: Payer: Self-pay | Admitting: Family

## 2022-09-06 ENCOUNTER — Other Ambulatory Visit: Payer: Self-pay | Admitting: Family

## 2022-09-06 DIAGNOSIS — N926 Irregular menstruation, unspecified: Secondary | ICD-10-CM

## 2022-09-11 ENCOUNTER — Telehealth: Payer: BC Managed Care – PPO | Admitting: Physician Assistant

## 2022-09-11 ENCOUNTER — Other Ambulatory Visit (HOSPITAL_BASED_OUTPATIENT_CLINIC_OR_DEPARTMENT_OTHER): Payer: Self-pay

## 2022-09-11 DIAGNOSIS — J069 Acute upper respiratory infection, unspecified: Secondary | ICD-10-CM | POA: Diagnosis not present

## 2022-09-11 MED ORDER — PROMETHAZINE-DM 6.25-15 MG/5ML PO SYRP
5.0000 mL | ORAL_SOLUTION | Freq: Four times a day (QID) | ORAL | 0 refills | Status: DC | PRN
Start: 2022-09-11 — End: 2023-03-28
  Filled 2022-09-11: qty 118, 6d supply, fill #0

## 2022-09-11 MED ORDER — FLUTICASONE PROPIONATE 50 MCG/ACT NA SUSP
2.0000 | Freq: Every day | NASAL | 0 refills | Status: DC
Start: 2022-09-11 — End: 2023-04-02
  Filled 2022-09-11: qty 16, 30d supply, fill #0

## 2022-09-11 MED ORDER — PREDNISONE 20 MG PO TABS
40.0000 mg | ORAL_TABLET | Freq: Every day | ORAL | 0 refills | Status: DC
Start: 2022-09-11 — End: 2023-03-28
  Filled 2022-09-11: qty 10, 5d supply, fill #0

## 2022-09-11 NOTE — Progress Notes (Signed)

## 2022-10-19 ENCOUNTER — Ambulatory Visit (INDEPENDENT_AMBULATORY_CARE_PROVIDER_SITE_OTHER): Payer: BC Managed Care – PPO | Admitting: Obstetrics and Gynecology

## 2022-10-19 ENCOUNTER — Encounter: Payer: Self-pay | Admitting: Obstetrics and Gynecology

## 2022-10-19 ENCOUNTER — Other Ambulatory Visit (HOSPITAL_BASED_OUTPATIENT_CLINIC_OR_DEPARTMENT_OTHER): Payer: Self-pay

## 2022-10-19 VITALS — BP 113/78 | HR 97 | Wt 186.0 lb

## 2022-10-19 DIAGNOSIS — E282 Polycystic ovarian syndrome: Secondary | ICD-10-CM | POA: Diagnosis not present

## 2022-10-19 DIAGNOSIS — Z3202 Encounter for pregnancy test, result negative: Secondary | ICD-10-CM | POA: Diagnosis not present

## 2022-10-19 LAB — POCT URINE PREGNANCY: Preg Test, Ur: NEGATIVE

## 2022-10-19 MED ORDER — MEDROXYPROGESTERONE ACETATE 10 MG PO TABS
10.0000 mg | ORAL_TABLET | Freq: Every day | ORAL | 11 refills | Status: DC
Start: 2022-10-19 — End: 2023-04-02
  Filled 2022-10-19: qty 10, 10d supply, fill #0

## 2022-10-19 NOTE — Progress Notes (Signed)
NEW GYNECOLOGY PATIENT Patient name: Jessica Johnson MRN [id number]  Date of birth: 1991/03/14 Chief Complaint:   Menstrual Problem     History:  Jessica Johnson is a 32 y.o. G2P1011 being seen today for irregular cycles/PCOS. Required IVF for her daughter 5 years ago. After delivery had regular cycles and then off cycle bleeding that was heavy and painful last summer. Since then cycles are now 30-40d, heavy and painful bleeding. Saw her OBGYN and got pap done followed by colpo and likely just a single off mesnes. Most recent mense was light and painless. Had been on pills and then nuvaring when younger then stopped to get pregnant. IVF process long (PCOS + female factor). After delivery short term use of "mini pill" and had mood disruption so stopped. Would welcome pregnancy and does not want to actively prevent it. Prior PCOS treatment: femara, metformin LMP 7/7 Also ntoes pain with intercourse just prior to onset of menses       Gynecologic History Patient's last menstrual period was 10/07/2022 (exact date). Contraception: none Last Pap: w/in last year w/ subsequent colpo Last Mammogram: n/a Last Colonoscopy: n/a  Obstetric History OB History  Gravida Para Term Preterm AB Living  2 1 1   1 1   SAB IAB Ectopic Multiple Live Births  1     0 1    # Outcome Date GA Lbr Len/2nd Weight Sex Type Anes PTL Lv  2 Term 02/05/18 [redacted]w[redacted]d  7 lb 12.5 oz (3.53 kg) F Vag-Spont EPI  LIV  1 SAB 03/16/17     SAB       Past Medical History:  Diagnosis Date   Family history of genetic disorder    CDH1 on pat side; pt is CDH1 neg   Genetic testing 10/2021   [facility] CDH1 NEG   Migraine with aura    PCOS (polycystic ovarian syndrome)     Past Surgical History:  Procedure Laterality Date   CHOLECYSTECTOMY N/A 02/22/2014   Procedure: LAPAROSCOPIC CHOLECYSTECTOMY WITH INTRAOPERATIVE CHOLANGIOGRAM;  Surgeon: Chevis Pretty III, MD;  Location: [facility] OR;  Service: General;  Laterality: N/A;   EYE SURGERY     lasik   2014    Current Outpatient Medications on File Prior to Visit  Medication Sig Dispense Refill   BERBERINE CHLORIDE PO Take 1,200 mg by mouth.     fluticasone (FLONASE) 50 MCG/ACT nasal spray Place 2 sprays into both nostrils daily. 16 g 0   predniSONE (DELTASONE) 20 MG tablet Take 2 tablets (40 mg total) by mouth daily with breakfast. 10 tablet 0   Prenatal Vit-Fe Fumarate-FA (PRENATAL MULTIVITAMIN) TABS tablet Take 1 tablet by mouth daily at 12 noon.     promethazine-dextromethorphan (PROMETHAZINE-DM) 6.25-15 MG/5ML syrup Take 5 mLs by mouth 4 (four) times daily as needed. 118 mL 0   Vitamin D, Ergocalciferol, (DRISDOL) 1.25 MG (50000 UNIT) CAPS capsule Take 1 capsule (50,000 Units total) by mouth every 7 (seven) days for 12 doses. 12 capsule 0   No current facility-administered medications on file prior to visit.    Allergies  Allergen Reactions   Penicillins Hives and Other (See Comments)    Has patient had a PCN reaction causing immediate rash, facial/tongue/throat swelling, SOB or lightheadedness with hypotension: Unknown Has patient had a PCN reaction causing severe rash involving mucus membranes or skin necrosis: Unknown Has patient had a PCN reaction that required hospitalization: Unknown Has patient had a PCN reaction occurring within the last 10 years: Unknown If all  of the above answers are "NO", then may proceed with Cephalosporin use.  All medication that are Cillins     Social History:  reports that she has never smoked. She has never used smokeless tobacco. She reports current alcohol use of about 1.0 standard drink of alcohol per week. She reports that she does not use drugs.  Family History  Problem Relation Age of Onset   Skin cancer Mother    Cervical cancer Mother 17   Breast cancer Maternal Grandmother        52s   Skin cancer Maternal Grandmother    Skin cancer Maternal Grandfather    Stomach cancer Paternal Grandfather        50s   Gastric cancer  Paternal Grandfather    Other Paternal Aunt        pos CDH1 gene   Other Paternal Uncle        pos CDH1 gene   Other Cousin        pos CDH1 gene   Other Cousin        pos CDH1 gene    The following portions of the patient's history were reviewed and updated as appropriate: allergies, current medications, past family history, past medical history, past social history, past surgical history and problem list.  Review of Systems Pertinent items noted in HPI and remainder of comprehensive ROS otherwise negative.  Physical Exam:  BP 113/78   Pulse 97   Wt 186 lb (84.4 kg)   LMP 10/07/2022 (Exact Date)   BMI 29.13 kg/m  Physical Exam Vitals and nursing note reviewed.  Constitutional:      Appearance: Normal appearance.  Cardiovascular:     Rate and Rhythm: Normal rate.  Pulmonary:     Effort: Pulmonary effort is normal.     Breath sounds: Normal breath sounds.  Neurological:     General: No focal deficit present.     Mental Status: She is alert and oriented to person, place, and time.  Psychiatric:        Mood and Affect: Mood normal.        Behavior: Behavior normal.        Thought Content: Thought content normal.        Judgment: Judgment normal.        Assessment and Plan:   1. PCOS (polycystic ovarian syndrome) Counseled patient about management of PCOS, recommended weight loss which helps with restoring ovulatory cycles, decreases glucose intolerance with improvement of metabolic risk, improves fertility/pregnancy rates and helps with overall health.  Even modest weight loss (5 to 10 percent reduction in body weight) in women with PCOS may result in these effects.  OCPs are also the mainstay of pharmacologic therapy for women with PCOS for managing hyperandrogenism and menstrual dysfunction and for providing contraception.  She does not want to actively contracept, so will do monthly/cyclic provera. Also place referral to nutrition services to help with weight management  and lifestyle modifications.    Routine preventative health maintenance measures emphasized. Please refer to After Visit Summary for other counseling recommendations.   Follow-up: Return in about 3 months (around 01/19/2023).      Lorriane Shire, MD Obstetrician & Gynecologist, Faculty Practice [facility] for [employer], [facility]

## 2022-10-29 ENCOUNTER — Ambulatory Visit: Payer: BC Managed Care – PPO | Admitting: Skilled Nursing Facility1

## 2022-11-01 ENCOUNTER — Other Ambulatory Visit (HOSPITAL_BASED_OUTPATIENT_CLINIC_OR_DEPARTMENT_OTHER): Payer: Self-pay

## 2022-11-01 MED ORDER — DIAZEPAM 10 MG PO TABS
10.0000 mg | ORAL_TABLET | ORAL | 0 refills | Status: DC
  Filled 2022-11-01: qty 3, 1d supply, fill #0

## 2022-11-14 ENCOUNTER — Other Ambulatory Visit (HOSPITAL_BASED_OUTPATIENT_CLINIC_OR_DEPARTMENT_OTHER): Payer: Self-pay

## 2022-11-14 MED ORDER — PREDNISONE 10 MG PO TABS
ORAL_TABLET | ORAL | 0 refills | Status: AC
  Filled 2022-11-14: qty 21, 6d supply, fill #0

## 2023-02-26 ENCOUNTER — Other Ambulatory Visit (HOSPITAL_BASED_OUTPATIENT_CLINIC_OR_DEPARTMENT_OTHER): Payer: Self-pay

## 2023-02-26 MED ORDER — PREDNISONE 10 MG PO TABS
ORAL_TABLET | ORAL | 0 refills | Status: AC
  Filled 2023-02-26: qty 21, 6d supply, fill #0

## 2023-03-28 ENCOUNTER — Telehealth: Payer: BC Managed Care – PPO | Admitting: Physician Assistant

## 2023-03-28 ENCOUNTER — Other Ambulatory Visit (HOSPITAL_BASED_OUTPATIENT_CLINIC_OR_DEPARTMENT_OTHER): Payer: Self-pay

## 2023-03-28 DIAGNOSIS — J208 Acute bronchitis due to other specified organisms: Secondary | ICD-10-CM

## 2023-03-28 MED ORDER — PROMETHAZINE-DM 6.25-15 MG/5ML PO SYRP
5.0000 mL | ORAL_SOLUTION | Freq: Four times a day (QID) | ORAL | 0 refills | Status: DC | PRN
Start: 2023-03-28 — End: 2023-04-02
  Filled 2023-03-28: qty 118, 6d supply, fill #0

## 2023-03-28 MED ORDER — PREDNISONE 20 MG PO TABS
40.0000 mg | ORAL_TABLET | Freq: Every day | ORAL | 0 refills | Status: DC
Start: 2023-03-28 — End: 2023-04-02
  Filled 2023-03-28: qty 10, 5d supply, fill #0

## 2023-03-28 NOTE — Progress Notes (Signed)
E-Visit for Cough   We are sorry that you are not feeling well.  Here is how we plan to help!  Based on your presentation I believe you most likely have A cough due to a virus.  This is called viral bronchitis and is best treated by rest, plenty of fluids and control of the cough.  You may use Ibuprofen or Tylenol as directed to help your symptoms.     In addition you may use Promethazine DM cough syrup Take 5 mL every 6 hours as needed for cough  Prednisone 20mg  Take 2 tablets (40mg ) daily for 5 days.  From your responses in the eVisit questionnaire you describe inflammation in the upper respiratory tract which is causing a significant cough.  This is commonly called Bronchitis and has four common causes:   Allergies Viral Infections Acid Reflux Bacterial Infection Allergies, viruses and acid reflux are treated by controlling symptoms or eliminating the cause. An example might be a cough caused by taking certain blood pressure medications. You stop the cough by changing the medication. Another example might be a cough caused by acid reflux. Controlling the reflux helps control the cough.     HOME CARE Only take medications as instructed by your medical team. Complete the entire course of an antibiotic. Drink plenty of fluids and get plenty of rest. Avoid close contacts especially the very young and the elderly Cover your mouth if you cough or cough into your sleeve. Always remember to wash your hands A steam or ultrasonic humidifier can help congestion.   GET HELP RIGHT AWAY IF: You develop worsening fever. You become short of breath You cough up blood. Your symptoms persist after you have completed your treatment plan MAKE SURE YOU  Understand these instructions. Will watch your condition. Will get help right away if you are not doing well or get worse.    Thank you for choosing an e-visit.  Your e-visit answers were reviewed by a board certified advanced clinical  practitioner to complete your personal care plan. Depending upon the condition, your plan could have included both over the counter or prescription medications.  Please review your pharmacy choice. Make sure the pharmacy is open so you can pick up prescription now. If there is a problem, you may contact your provider through [employer] and have the prescription routed to another pharmacy.  Your safety is important to [place]. If you have drug allergies check your prescription carefully.   For the next 24 hours you can use MyChart to ask questions about today's visit, request a non-urgent call back, or ask for a work or school excuse. You will get an email in the next two days asking about your experience. I hope that your e-visit has been valuable and will speed your recovery.   I have spent 5 minutes in review of e-visit questionnaire, review and updating patient chart, medical decision making and response to patient.   [name], PA-C

## 2023-04-02 ENCOUNTER — Other Ambulatory Visit (HOSPITAL_BASED_OUTPATIENT_CLINIC_OR_DEPARTMENT_OTHER): Payer: Self-pay

## 2023-04-02 ENCOUNTER — Ambulatory Visit: Payer: BC Managed Care – PPO | Admitting: Family

## 2023-04-02 ENCOUNTER — Encounter: Payer: Self-pay | Admitting: Family

## 2023-04-02 ENCOUNTER — Ambulatory Visit (HOSPITAL_BASED_OUTPATIENT_CLINIC_OR_DEPARTMENT_OTHER)
Admission: RE | Admit: 2023-04-02 | Discharge: 2023-04-02 | Disposition: A | Discharge status: Home / Self Care | Payer: BC Managed Care – PPO | Source: Ambulatory Visit | Attending: Family | Admitting: Family

## 2023-04-02 VITALS — BP 118/74 | HR 67 | Temp 97.9°F | Ht 67.0 in | Wt 182.4 lb

## 2023-04-02 DIAGNOSIS — R053 Chronic cough: Secondary | ICD-10-CM | POA: Diagnosis not present

## 2023-04-02 MED ORDER — HYDROCODONE BIT-HOMATROP MBR 5-1.5 MG/5ML PO SOLN
5.0000 mL | Freq: Every evening | ORAL | 0 refills | Status: DC | PRN
  Filled 2023-04-02: qty 120, 24d supply, fill #0

## 2023-04-02 MED ORDER — AZITHROMYCIN 250 MG PO TABS
ORAL_TABLET | ORAL | 0 refills | Status: DC
  Filled 2023-04-02: qty 6, 5d supply, fill #0

## 2023-04-02 NOTE — Progress Notes (Signed)
 Jessica Johnson is a 32 y.o. female with the following history as recorded in EpicCare:  Patient Active Problem List   Diagnosis Date Noted   Preventative health care 11/21/2018   Migraines 02/18/2018   GERD (gastroesophageal reflux disease) 02/18/2018    Current Outpatient Medications  Medication Sig Dispense Refill   azithromycin  (ZITHROMAX  Z-PAK) 250 MG tablet Take 2 tablets (500 mg total) by mouth on day 1, and then take 1 tablet (250 mg total) daily on days 2 through 5. 6 tablet 0   HYDROcodone  bit-homatropine (HYCODAN) 5-1.5 MG/5ML syrup Take 5 mLs by mouth at bedtime as needed for cough. 120 mL 0   No current facility-administered medications for this visit.    Allergies: Penicillins  Past Medical History:  Diagnosis Date   Family history of genetic disorder    CDH1 on pat side; pt is CDH1 neg   Genetic testing 10/2021   Myriad CDH1 NEG   Migraine with aura    PCOS (polycystic ovarian syndrome)     Past Surgical History:  Procedure Laterality Date   CHOLECYSTECTOMY N/A 02/22/2014   Procedure: LAPAROSCOPIC CHOLECYSTECTOMY WITH INTRAOPERATIVE CHOLANGIOGRAM;  Surgeon: Deward Null III, MD;  Location: [facility] OR;  Service: General;  Laterality: N/A;   EYE SURGERY     lasik  2014    Family History  Problem Relation Age of Onset   Skin cancer Mother    Cervical cancer Mother 64   Breast cancer Maternal Grandmother        2s   Skin cancer Maternal Grandmother    Skin cancer Maternal Grandfather    Stomach cancer Paternal Grandfather        34s   Gastric cancer Paternal Grandfather    Other Paternal Aunt        pos CDH1 gene   Other Paternal Uncle        pos CDH1 gene   Other Cousin        pos CDH1 gene   Other Cousin        pos CDH1 gene    Social History   Tobacco Use   Smoking status: Never   Smokeless tobacco: Never  Substance Use Topics   Alcohol use: Yes    Alcohol/week: 1.0 standard drink of alcohol    Types: 1 Cans of beer per week    Comment: occasionally     Subjective:   Concerned for possible pneumonia; notes that provider she works with listened to her lungs and was concerned; does feel easily winded; difficulty sleeping at night due to coughing;  Had virtual visit on 12/26 and just finished oral prednisone  with no relief;     Objective:  Vitals:   04/02/23 1306  BP: 118/74  Pulse: 67  Temp: 97.9 F (36.6 C)  TempSrc: Oral  SpO2: 98%  Weight: 182 lb 6.4 oz (82.7 kg)  Height: 5' 7 (1.702 m)    General: Well developed, well nourished, in no acute distress  Skin : Warm and dry.  Head: Normocephalic and atraumatic  Eyes: Sclera and conjunctiva clear; pupils round and reactive to light; extraocular movements intact  Ears: External normal; canals clear; tympanic membranes normal  Oropharynx: Pink, supple. No suspicious lesions  Neck: Supple without thyromegaly, adenopathy  Lungs: Respirations unlabored; clear to auscultation bilaterally without wheeze, rales, rhonchi  CVS exam: normal rate and regular rhythm.  Neurologic: Alert and oriented; speech intact; face symmetrical; moves all extremities well; CNII-XII intact without focal deficit   Assessment:  1.  Persistent cough     Plan:  Concern for pneumonia; update CXR today; Rx for Z-pak #1 take as directed; Rx for Hycodan cough syrup to use as night as needed; increase fluids, rest and follow up to be determined based on CXR and how patient is feeling.   No follow-ups on file.  Orders Placed This Encounter  Procedures   DG Chest 2 View    Standing Status:   Future    Number of Occurrences:   1    Expiration Date:   04/01/2024    Reason for Exam (SYMPTOM  OR DIAGNOSIS REQUIRED):   persistent cough    Is patient pregnant?:   No    Preferred imaging location?:   [facility]    Requested Prescriptions   Signed Prescriptions Disp Refills   azithromycin  (ZITHROMAX  Z-PAK) 250 MG tablet 6 tablet 0    Sig: Take 2 tablets (500 mg total) by mouth on day 1, and then  take 1 tablet (250 mg total) daily on days 2 through 5.   HYDROcodone  bit-homatropine (HYCODAN) 5-1.5 MG/5ML syrup 120 mL 0    Sig: Take 5 mLs by mouth at bedtime as needed for cough.

## 2023-04-02 NOTE — Telephone Encounter (Unsigned)
 Copied from CRM [id number]. Topic: Clinical - Medical Advice >> Apr 02, 2023  9:21 AM Corean SAUNDERS wrote: PT has an appointment for possible pneumonia on Jan 2 but is inquiring about her PCP would like to get her a Z pack and or a chest X-ray prior to her appointment.

## 2023-04-04 ENCOUNTER — Ambulatory Visit: Payer: BC Managed Care – PPO | Admitting: Medical

## 2023-04-08 ENCOUNTER — Encounter: Payer: Self-pay | Admitting: Family

## 2023-04-09 ENCOUNTER — Encounter: Payer: Self-pay | Admitting: Family

## 2023-04-09 ENCOUNTER — Ambulatory Visit: Payer: BC Managed Care – PPO | Admitting: Family

## 2023-04-09 ENCOUNTER — Other Ambulatory Visit (HOSPITAL_BASED_OUTPATIENT_CLINIC_OR_DEPARTMENT_OTHER): Payer: Self-pay

## 2023-04-09 VITALS — BP 114/82 | HR 89 | Temp 98.5°F | Ht 67.0 in | Wt 182.4 lb

## 2023-04-09 DIAGNOSIS — J9801 Acute bronchospasm: Secondary | ICD-10-CM | POA: Diagnosis not present

## 2023-04-09 MED ORDER — ALBUTEROL SULFATE HFA 108 (90 BASE) MCG/ACT IN AERS
2.0000 | INHALATION_SPRAY | Freq: Four times a day (QID) | RESPIRATORY_TRACT | 2 refills | Status: DC | PRN
  Filled 2023-04-09: qty 6.7, 20d supply, fill #0

## 2023-04-09 MED ORDER — PREDNISONE 20 MG PO TABS
40.0000 mg | ORAL_TABLET | Freq: Every day | ORAL | 0 refills | Status: DC
  Filled 2023-04-09: qty 10, 5d supply, fill #0

## 2023-04-09 NOTE — Progress Notes (Signed)
 Jessica Johnson is a 33 y.o. female with the following history as recorded in EpicCare:  Patient Active Problem List   Diagnosis Date Noted   Preventative health care 11/21/2018   Migraines 02/18/2018   GERD (gastroesophageal reflux disease) 02/18/2018    Current Outpatient Medications  Medication Sig Dispense Refill   albuterol  (VENTOLIN  HFA) 108 (90 Base) MCG/ACT inhaler Inhale 2 puffs into the lungs every 6 (six) hours as needed for wheezing or shortness of breath. 6.7 g 2   HYDROcodone  bit-homatropine (HYCODAN) 5-1.5 MG/5ML syrup Take 5 mLs by mouth at bedtime as needed for cough. 120 mL 0   predniSONE  (DELTASONE ) 20 MG tablet Take 2 tablets (40 mg total) by mouth daily with breakfast. 10 tablet 0   azithromycin  (ZITHROMAX  Z-PAK) 250 MG tablet Take 2 tablets (500 mg total) by mouth on day 1, and then take 1 tablet (250 mg total) daily on days 2 through 5. (Patient not taking: Reported on 04/09/2023) 6 tablet 0   No current facility-administered medications for this visit.    Allergies: Penicillins  Past Medical History:  Diagnosis Date   Family history of genetic disorder    CDH1 on pat side; pt is CDH1 neg   Genetic testing 10/2021   Myriad CDH1 NEG   Migraine with aura    PCOS (polycystic ovarian syndrome)     Past Surgical History:  Procedure Laterality Date   CHOLECYSTECTOMY N/A 02/22/2014   Procedure: LAPAROSCOPIC CHOLECYSTECTOMY WITH INTRAOPERATIVE CHOLANGIOGRAM;  Surgeon: Deward Null III, MD;  Location: [facility] OR;  Service: General;  Laterality: N/A;   EYE SURGERY     lasik  2014    Family History  Problem Relation Age of Onset   Skin cancer Mother    Cervical cancer Mother 1   Breast cancer Maternal Grandmother        44s   Skin cancer Maternal Grandmother    Skin cancer Maternal Grandfather    Stomach cancer Paternal Grandfather        44s   Gastric cancer Paternal Grandfather    Other Paternal Aunt        pos CDH1 gene   Other Paternal Uncle        pos CDH1 gene    Other Cousin        pos CDH1 gene   Other Cousin        pos CDH1 gene    Social History   Tobacco Use   Smoking status: Never   Smokeless tobacco: Never  Substance Use Topics   Alcohol use: Yes    Alcohol/week: 1.0 standard drink of alcohol    Types: 1 Cans of beer per week    Comment: occasionally    Subjective:   Seen on 12/26 for virtual e-visit and diagnosed with viral bronchitis; seen in person on 12/31 with concerns for persisting symptoms- CXR was clear and given Z-pak to cover for possible walking pneumonia; does note that she is actually feeling better but is concerned about persisting cough;   Objective:  Vitals:   04/09/23 1502  BP: 114/82  Pulse: 89  Temp: 98.5 F (36.9 C)  TempSrc: Oral  SpO2: 95%  Weight: 182 lb 6.4 oz (82.7 kg)  Height: 5' 7 (1.702 m)    General: Well developed, well nourished, in no acute distress  Skin : Warm and dry.  Head: Normocephalic and atraumatic  Eyes: Sclera and conjunctiva clear; pupils round and reactive to light; extraocular movements intact  Ears: External normal;  canals clear; tympanic membranes normal  Oropharynx: Pink, supple. No suspicious lesions  Neck: Supple without thyromegaly, adenopathy  Lungs: Respirations unlabored; clear to auscultation bilaterally without wheeze, rales, rhonchi  CVS exam: normal rate and regular rhythm.  Neurologic: Alert and oriented; speech intact; face symmetrical; moves all extremities well; CNII-XII intact without focal deficit   Assessment:  1. Acute bronchospasm     Plan:  Do not feel that patient needs another antibiotic at this time; Will re-treat with course of prednisone  x 5 more days; refill updated on albuterol  to use as needed; increase fluids, rest and follow up worse, no better.   No follow-ups on file.  No orders of the defined types were placed in this encounter.   Requested Prescriptions   Signed Prescriptions Disp Refills   albuterol  (VENTOLIN  HFA) 108 (90 Base)  MCG/ACT inhaler 6.7 g 2    Sig: Inhale 2 puffs into the lungs every 6 (six) hours as needed for wheezing or shortness of breath.   predniSONE  (DELTASONE ) 20 MG tablet 10 tablet 0    Sig: Take 2 tablets (40 mg total) by mouth daily with breakfast.

## 2023-11-01 DIAGNOSIS — L578 Other skin changes due to chronic exposure to nonionizing radiation: Secondary | ICD-10-CM | POA: Diagnosis not present

## 2023-11-01 DIAGNOSIS — D225 Melanocytic nevi of trunk: Secondary | ICD-10-CM | POA: Diagnosis not present

## 2023-11-01 DIAGNOSIS — D2239 Melanocytic nevi of other parts of face: Secondary | ICD-10-CM | POA: Diagnosis not present

## 2023-11-01 DIAGNOSIS — L814 Other melanin hyperpigmentation: Secondary | ICD-10-CM | POA: Diagnosis not present

## 2023-11-14 ENCOUNTER — Ambulatory Visit: Admitting: Family

## 2023-11-14 VITALS — BP 118/64 | HR 66 | Temp 98.6°F | Ht 67.0 in | Wt 184.6 lb

## 2023-11-14 DIAGNOSIS — Z13220 Encounter for screening for lipoid disorders: Secondary | ICD-10-CM

## 2023-11-14 DIAGNOSIS — Z0000 Encounter for general adult medical examination without abnormal findings: Secondary | ICD-10-CM

## 2023-11-14 DIAGNOSIS — E559 Vitamin D deficiency, unspecified: Secondary | ICD-10-CM

## 2023-11-14 NOTE — Progress Notes (Signed)
 Jessica Johnson is a 33 y.o. female with the following history as recorded in EpicCare:  Patient Active Problem List   Diagnosis Date Noted  . Preventative health care 11/21/2018  . Migraines 02/18/2018  . GERD (gastroesophageal reflux disease) 02/18/2018    No current outpatient medications on file.   No current facility-administered medications for this visit.    Allergies: Penicillins  Past Medical History:  Diagnosis Date  . Family history of genetic disorder    CDH1 on pat side; pt is CDH1 neg  . Genetic testing 10/2021   Myriad CDH1 NEG  . Migraine with aura   . PCOS (polycystic ovarian syndrome)     Past Surgical History:  Procedure Laterality Date  . CHOLECYSTECTOMY N/A 02/22/2014   Procedure: LAPAROSCOPIC CHOLECYSTECTOMY WITH INTRAOPERATIVE CHOLANGIOGRAM;  Surgeon: Deward Null III, MD;  Location: [facility] OR;  Service: General;  Laterality: N/A;  . EYE SURGERY     lasik  2014    Family History  Problem Relation Age of Onset  . Skin cancer Mother   . Cervical cancer Mother 63  . Breast cancer Maternal Grandmother        61s  . Skin cancer Maternal Grandmother   . Skin cancer Maternal Grandfather   . Stomach cancer Paternal Grandfather        33s  . Gastric cancer Paternal Grandfather   . Other Paternal Aunt        pos CDH1 gene  . Other Paternal Uncle        pos CDH1 gene  . Other Cousin        pos CDH1 gene  . Other Cousin        pos CDH1 gene    Social History   Tobacco Use  . Smoking status: Never  . Smokeless tobacco: Never  Substance Use Topics  . 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   HENT: Negative.    Eyes: Negative.   Respiratory: Negative.    Cardiovascular: Negative.   Gastrointestinal: Negative.   Genitourinary: Negative.   Musculoskeletal: Negative.   Skin: Negative.   Neurological: Negative.   Endo/Heme/Allergies: Negative.   Psychiatric/Behavioral: Negative.       Objective:  Vitals:   11/14/23 1305  BP: 118/64  Pulse: 66  Temp: 98.6 F (37 C)  TempSrc: Oral  SpO2: 96%  Weight: 184 lb 9.6 oz (83.7 kg)  Height: 5' 7 (1.702 m)    General: Well developed, well nourished, in no acute distress  Skin : Warm and dry.  Head: Normocephalic and atraumatic  Eyes: Sclera and conjunctiva clear; pupils round and reactive to light; extraocular movements intact  Ears: External normal; canals clear; tympanic membranes normal  Oropharynx: Pink, supple. No suspicious lesions  Neck: Supple without thyromegaly, adenopathy  Lungs: Respirations unlabored; clear to auscultation bilaterally without wheeze, rales, rhonchi  CVS exam: normal rate and regular rhythm.  Abdomen: Soft; nontender; nondistended; normoactive bowel sounds; no masses or hepatosplenomegaly  Musculoskeletal: No deformities; no  active joint inflammation  Extremities: No edema, cyanosis, clubbing  Vessels: Symmetric bilaterally  Neurologic: Alert and oriented; speech intact; face symmetrical; moves all extremities well; CNII-XII intact without focal deficit   Assessment:  1. PE (physical exam), annual   2. Lipid screening   3. Vitamin D  deficiency     Plan:  Age appropriate preventive healthcare needs addressed; encouraged regular eye doctor and dental exams; encouraged regular exercise; will update labs and refills as needed today; follow-up in 1 year, sooner prn.    No follow-ups on file.  Orders Placed This Encounter  Procedures  . CBC with Differential/Platelet  . Comp Met (CMET)  . Lipid panel  . Vitamin  D (25 hydroxy)    Requested Prescriptions    No prescriptions requested or ordered in this encounter

## 2023-11-14 NOTE — Patient Instructions (Signed)
 Please let us  know once you find your vaccine records- we will need to update titers if you can't get that information.

## 2023-11-15 ENCOUNTER — Ambulatory Visit: Payer: Self-pay | Admitting: Family

## 2023-11-15 ENCOUNTER — Telehealth: Payer: Self-pay | Admitting: Family

## 2023-11-15 ENCOUNTER — Telehealth: Payer: Self-pay

## 2023-11-15 LAB — CBC WITH DIFFERENTIAL/PLATELET
Basophils Absolute: 0 K/uL (ref 0.0–0.1)
Basophils Relative: 0.5 % (ref 0.0–3.0)
Eosinophils Absolute: 0.1 K/uL (ref 0.0–0.7)
Eosinophils Relative: 0.9 % (ref 0.0–5.0)
HCT: 41.7 % (ref 36.0–46.0)
Hemoglobin: 13.9 g/dL (ref 12.0–15.0)
Lymphocytes Relative: 24.5 % (ref 12.0–46.0)
Lymphs Abs: 2.3 K/uL (ref 0.7–4.0)
MCHC: 33.4 g/dL (ref 30.0–36.0)
MCV: 78.9 fl (ref 78.0–100.0)
Monocytes Absolute: 0.5 K/uL (ref 0.1–1.0)
Monocytes Relative: 4.9 % (ref 3.0–12.0)
Neutro Abs: 6.5 K/uL (ref 1.4–7.7)
Neutrophils Relative %: 69.2 % (ref 43.0–77.0)
Platelets: 269 K/uL (ref 150.0–400.0)
RBC: 5.28 Mil/uL — ABNORMAL HIGH (ref 3.87–5.11)
RDW: 13.7 % (ref 11.5–15.5)
WBC: 9.3 K/uL (ref 4.0–10.5)

## 2023-11-15 LAB — COMPREHENSIVE METABOLIC PANEL WITH GFR
ALT: 13 U/L (ref 0–35)
AST: 15 U/L (ref 0–37)
Albumin: 4.4 g/dL (ref 3.5–5.2)
Alkaline Phosphatase: 55 U/L (ref 39–117)
BUN: 12 mg/dL (ref 6–23)
CO2: 24 meq/L (ref 19–32)
Calcium: 9.2 mg/dL (ref 8.4–10.5)
Chloride: 103 meq/L (ref 96–112)
Creatinine, Ser: 0.8 mg/dL (ref 0.40–1.20)
GFR: 97.61 mL/min (ref >60.00)
Glucose, Bld: 77 mg/dL (ref 70–99)
Potassium: 4.3 meq/L (ref 3.5–5.1)
Sodium: 137 meq/L (ref 135–145)
Total Bilirubin: 0.5 mg/dL (ref 0.2–1.2)
Total Protein: 7.3 g/dL (ref 6.0–8.3)

## 2023-11-15 LAB — VITAMIN D 25 HYDROXY (VIT D DEFICIENCY, FRACTURES): VITD: 28.73 ng/mL — ABNORMAL LOW (ref 30.00–100.00)

## 2023-11-15 LAB — LIPID PANEL
Cholesterol: 181 mg/dL (ref 0–200)
HDL: 46.7 mg/dL (ref >39.00)
LDL Cholesterol: 112 mg/dL — ABNORMAL HIGH (ref 0–99)
NonHDL: 134.61
Total CHOL/HDL Ratio: 4
Triglycerides: 111 mg/dL (ref 0.0–149.0)
VLDL: 22.2 mg/dL (ref 0.0–40.0)

## 2023-11-15 NOTE — Telephone Encounter (Signed)
 Pt dropped off paper to be signed by pcp. Left paper in pcps box. Please call when paper is complete.

## 2023-11-15 NOTE — Telephone Encounter (Signed)
 Copied from CRM #[id number]. Topic: General - Other >> Nov 15, 2023  2:34 PM Thersia BROCKS wrote: Reason for CRM: Patient called in would like for Harlene to give her a callback

## 2023-11-15 NOTE — Telephone Encounter (Signed)
 Spoke to pt, pt is aware forms have been filled out by PCP. Will scan form to pt and place up front for pt pickup.

## 2023-11-15 NOTE — Telephone Encounter (Signed)
 Form has been completed by PCP, called pt and advised pt I will scan to pts email and place up front for pt pick up.

## 2024-01-30 DIAGNOSIS — G43109 Migraine with aura, not intractable, without status migrainosus: Secondary | ICD-10-CM | POA: Diagnosis not present

## 2024-01-30 DIAGNOSIS — Z6828 Body mass index (BMI) 28.0-28.9, adult: Secondary | ICD-10-CM | POA: Diagnosis not present

## 2024-01-30 DIAGNOSIS — R635 Abnormal weight gain: Secondary | ICD-10-CM | POA: Diagnosis not present

## 2024-01-30 DIAGNOSIS — E7800 Pure hypercholesterolemia, unspecified: Secondary | ICD-10-CM | POA: Diagnosis not present

## 2024-01-30 DIAGNOSIS — E282 Polycystic ovarian syndrome: Secondary | ICD-10-CM | POA: Diagnosis not present

## 2024-01-30 DIAGNOSIS — E559 Vitamin D deficiency, unspecified: Secondary | ICD-10-CM | POA: Diagnosis not present

## 2024-01-30 DIAGNOSIS — K219 Gastro-esophageal reflux disease without esophagitis: Secondary | ICD-10-CM | POA: Diagnosis not present

## 2024-02-03 DIAGNOSIS — E282 Polycystic ovarian syndrome: Secondary | ICD-10-CM | POA: Diagnosis not present

## 2024-02-03 DIAGNOSIS — E7800 Pure hypercholesterolemia, unspecified: Secondary | ICD-10-CM | POA: Diagnosis not present

## 2024-02-03 DIAGNOSIS — R718 Other abnormality of red blood cells: Secondary | ICD-10-CM | POA: Diagnosis not present

## 2024-02-03 DIAGNOSIS — K219 Gastro-esophageal reflux disease without esophagitis: Secondary | ICD-10-CM | POA: Diagnosis not present

## 2024-02-04 DIAGNOSIS — E282 Polycystic ovarian syndrome: Secondary | ICD-10-CM | POA: Diagnosis not present

## 2024-02-04 DIAGNOSIS — Z6828 Body mass index (BMI) 28.0-28.9, adult: Secondary | ICD-10-CM | POA: Diagnosis not present

## 2024-02-14 DIAGNOSIS — E282 Polycystic ovarian syndrome: Secondary | ICD-10-CM | POA: Diagnosis not present

## 2024-02-14 DIAGNOSIS — Z6828 Body mass index (BMI) 28.0-28.9, adult: Secondary | ICD-10-CM | POA: Diagnosis not present

## 2024-02-21 DIAGNOSIS — K219 Gastro-esophageal reflux disease without esophagitis: Secondary | ICD-10-CM | POA: Diagnosis not present

## 2024-02-21 DIAGNOSIS — Z6827 Body mass index (BMI) 27.0-27.9, adult: Secondary | ICD-10-CM | POA: Diagnosis not present

## 2024-02-24 ENCOUNTER — Telehealth: Payer: Self-pay

## 2024-02-24 DIAGNOSIS — Z111 Encounter for screening for respiratory tuberculosis: Secondary | ICD-10-CM

## 2024-02-24 NOTE — Telephone Encounter (Signed)
 Patient needs tb [name] appointment scheduled for March Copied from CRM #[id number]. Topic: Clinical - Request for Lab/Test Order >> Feb 24, 2024  3:05 PM Jessica Johnson wrote: Reason for CRM: Patient needs order put in for blood tb test, please call her to schedule when order is in at [phone] (H) - she needs this done for school before 03/13/24

## 2024-02-25 NOTE — Telephone Encounter (Signed)
 Called patient but no answer. Left detail message for her to come back. She will need lab appointment for TB gold.

## 2024-02-25 NOTE — Telephone Encounter (Signed)
Future order has been placed.  

## 2024-03-02 NOTE — Telephone Encounter (Signed)
 Patient is now scheduled for 9/04

## 2024-03-05 ENCOUNTER — Other Ambulatory Visit (INDEPENDENT_AMBULATORY_CARE_PROVIDER_SITE_OTHER)

## 2024-03-05 DIAGNOSIS — Z111 Encounter for screening for respiratory tuberculosis: Secondary | ICD-10-CM

## 2024-03-05 DIAGNOSIS — E663 Overweight: Secondary | ICD-10-CM | POA: Diagnosis not present

## 2024-03-05 DIAGNOSIS — E282 Polycystic ovarian syndrome: Secondary | ICD-10-CM | POA: Diagnosis not present

## 2024-03-05 DIAGNOSIS — Z6826 Body mass index (BMI) 26.0-26.9, adult: Secondary | ICD-10-CM | POA: Diagnosis not present

## 2024-03-08 LAB — QUANTIFERON-TB GOLD PLUS
Mitogen-NIL: 7.28 [IU]/mL
NIL: 0.01 [IU]/mL
QuantiFERON-TB Gold Plus: NEGATIVE
TB1-NIL: 0 [IU]/mL
TB2-NIL: 0 [IU]/mL

## 2024-03-09 ENCOUNTER — Ambulatory Visit: Payer: Self-pay | Admitting: Family

## 2024-06-05 ENCOUNTER — Encounter: Admitting: Family
